# Patient Record
Sex: Female | Born: 1960 | Race: White | Hispanic: No | Marital: Married | Smoking: Current every day smoker
Health system: Southern US, Community
[De-identification: ages and names within clinical notes are randomized; demographics above are authoritative.]

---

## 2002-02-13 ENCOUNTER — Other Ambulatory Visit: Admission: RE | Admit: 2002-02-13 | Discharge: 2002-02-13 | Payer: Self-pay | Admitting: Obstetrics and Gynecology

## 2003-03-27 ENCOUNTER — Other Ambulatory Visit: Payer: Self-pay

## 2005-07-26 ENCOUNTER — Emergency Department: Payer: Self-pay | Admitting: Internal Medicine

## 2006-08-16 ENCOUNTER — Ambulatory Visit: Payer: Self-pay | Admitting: Obstetrics and Gynecology

## 2006-09-06 ENCOUNTER — Ambulatory Visit: Payer: Self-pay | Admitting: Obstetrics and Gynecology

## 2006-12-01 ENCOUNTER — Ambulatory Visit (HOSPITAL_COMMUNITY): Admission: RE | Admit: 2006-12-01 | Discharge: 2006-12-01 | Payer: Self-pay | Admitting: Obstetrics and Gynecology

## 2007-02-21 ENCOUNTER — Ambulatory Visit (HOSPITAL_COMMUNITY): Admission: RE | Admit: 2007-02-21 | Discharge: 2007-02-21 | Payer: Self-pay | Admitting: Obstetrics and Gynecology

## 2007-12-05 ENCOUNTER — Ambulatory Visit: Payer: Self-pay | Admitting: Obstetrics and Gynecology

## 2008-03-23 HISTORY — PX: TUBAL LIGATION: SHX77

## 2010-07-31 ENCOUNTER — Ambulatory Visit: Payer: Self-pay | Admitting: Obstetrics & Gynecology

## 2010-08-05 NOTE — Op Note (Signed)
Whitney Hunt, Whitney Hunt                   ACCOUNT NO.:  [id number]   MEDICAL RECORD NO.:  [id number]          PATIENT TYPE:  AMB   LOCATION:  SDC                           FACILITY:  [facility]   PHYSICIAN:  Sherry A. Dickstein, M.D.DATE OF BIRTH:  [birth year]   DATE OF PROCEDURE:  12/01/2006  DATE OF DISCHARGE:                               OPERATIVE REPORT   PREOPERATIVE DIAGNOSIS:  Desires sterilization.   POSTOPERATIVE DIAGNOSIS:  Desires sterilization.   PROCEDURE:  Essure tubal occlusion.   SURGEON:  Sherry A. Rosalio Macadamia, M.D.   ANESTHESIA:  MAC.   INDICATIONS FOR PROCEDURE:  The patient is a 50 year old woman who  requests permanent sterilization procedure.  She has used a Merino in  the past but she did not like this and had to have it removed for a LEEP  procedure.  The patient wants Essure tubal occlusion.  If this did not  work, the patient requested laparoscopic tubal cautery.  The patient  understands the risks involved as well as the failure rate and  alternatives to this procedure.   FINDINGS:  Normal size uterus with normal cornua present.   PROCEDURE:  The patient was brought into the operating room and given  adequate IV sedation.  She was placed in the dorsal lithotomy position.  Her perineum was washed with Betadine.  A pelvic examination was  performed.  The patient was then draped in a sterile fashion.  The  surgeons gown and gloves were changed.  A speculum was placed within the  vagina.  The vagina was washed with Betadine.  A paracervical block was  administered with 1% Xylocaine.  The cervix was grasped with a single  tooth tenaculum.  The cervix was dilated with Pratt dilators to a #19.  The hysteroscope was introduced in the endometrial cavity.  The cornua  were visualized.  Using the Essure rings, they were introduced into the  left cornua and placed in the usual fashion.  They were introduced and  removed with three coils present in each cornua, first the left  cornua  was placed then the right cornua was placed, with three coils present at  the end.  Pictures were obtained.  No abnormalities were seen in the  endometrial cavity.  All instruments were removed from the vagina.  The  patient was taken out of the dorsal lithotomy position.  She was  awakened. She was moved from the operating room to a stretcher in stable  condition.  Complications were none.  Estimated blood loss less than 5  mL.      Sherry A. Rosalio Macadamia, M.D.  Electronically Signed     SAD/MEDQ  D:  12/01/2006  T:  12/01/2006  Job:  [id number]

## 2011-01-02 LAB — CBC
MCHC: 34.3
MCV: 92
Platelets: 304
RBC: 4.86

## 2011-12-10 ENCOUNTER — Ambulatory Visit: Payer: Self-pay | Admitting: Obstetrics & Gynecology

## 2011-12-14 ENCOUNTER — Encounter: Payer: Self-pay | Admitting: Internal Medicine

## 2012-01-22 ENCOUNTER — Ambulatory Visit (AMBULATORY_SURGERY_CENTER): Payer: BC Managed Care – PPO | Admitting: *Deleted

## 2012-01-22 VITALS — Ht 63.0 in | Wt 127.0 lb

## 2012-01-22 DIAGNOSIS — Z1211 Encounter for screening for malignant neoplasm of colon: Secondary | ICD-10-CM

## 2012-01-22 MED ORDER — MOVIPREP 100 G PO SOLR: ORAL | Status: DC

## 2012-02-05 ENCOUNTER — Ambulatory Visit (AMBULATORY_SURGERY_CENTER): Payer: BC Managed Care – PPO | Admitting: Internal Medicine

## 2012-02-05 VITALS — BP 105/65 | HR 70 | Temp 97.4°F | Resp 18 | Ht 63.0 in | Wt 127.0 lb

## 2012-02-05 DIAGNOSIS — Z1211 Encounter for screening for malignant neoplasm of colon: Secondary | ICD-10-CM

## 2012-02-05 MED ORDER — SODIUM CHLORIDE 0.9 % IV SOLN: 500.0000 mL | INTRAVENOUS | Status: DC

## 2012-02-05 NOTE — Op Note (Signed)
[facility] [address] [place], [address]   COLONOSCOPY PROCEDURE REPORT  PATIENT: Whitney, Hunt  MR#: [id number] BIRTHDATE: 01-28-1961 , 51  yrs. old GENDER: Female ENDOSCOPIST: Hart Carwin, MD REFERRED BY:  Genia Del, M.D. PROCEDURE DATE:  02/05/2012 PROCEDURE:   Colonoscopy, screening ASA CLASS:   Class I INDICATIONS:average risk patient for colon cancer. MEDICATIONS: MAC sedation, administered by CRNA and propofol (Diprivan) 250mg  IV  DESCRIPTION OF PROCEDURE:   After the risks and benefits and of the procedure were explained, informed consent was obtained.  A digital rectal exam revealed no abnormalities of the rectum.    The LB [id number]-[id number]  endoscope was introduced through the anus and advanced to the cecum, which was identified by both the appendix and ileocecal valve .  The quality of the prep was good, using MoviPrep .  The instrument was then slowly withdrawn as the colon was fully examined.     COLON FINDINGS: Mild diverticulosis was noted in the descending colon.     Retroflexed views revealed no abnormalities.     The scope was then withdrawn from the patient and the procedure completed.  COMPLICATIONS: There were no complications. ENDOSCOPIC IMPRESSION: Mild diverticulosis was noted in the descending colon  RECOMMENDATIONS: High fiber diet   REPEAT EXAM: In 10 year(s)  for Colonoscopy.  cc:  _______________________________ eSignedHart Carwin, MD 02/05/2012 12:20 PM

## 2012-02-05 NOTE — Patient Instructions (Signed)
YOU HAD AN ENDOSCOPIC PROCEDURE TODAY AT THE [facility]: Refer to the procedure report that was given to you for any specific questions about what was found during the examination.  If the procedure report does not answer your questions, please call your gastroenterologist to clarify.  If you requested that your care partner not be given the details of your procedure findings, then the procedure report has been included in a sealed envelope for you to review at your convenience later.  YOU SHOULD EXPECT: Some feelings of bloating in the abdomen. Passage of more gas than usual.  Walking can help get rid of the air that was put into your GI tract during the procedure and reduce the bloating. If you had a lower endoscopy (such as a colonoscopy or flexible sigmoidoscopy) you may notice spotting of blood in your stool or on the toilet paper. If you underwent a bowel prep for your procedure, then you may not have a normal bowel movement for a few days.  DIET: Your first meal following the procedure should be a light meal and then it is ok to progress to your normal diet.  A half-sandwich or bowl of soup is an example of a good first meal.  Heavy or fried foods are harder to digest and may make you feel nauseous or bloated.  Likewise meals heavy in dairy and vegetables can cause extra gas to form and this can also increase the bloating.  Drink plenty of fluids but you should avoid alcoholic beverages for 24 hours.  ACTIVITY: Your care partner should take you home directly after the procedure.  You should plan to take it easy, moving slowly for the rest of the day.  You can resume normal activity the day after the procedure however you should NOT DRIVE or use heavy machinery for 24 hours (because of the sedation medicines used during the test).    SYMPTOMS TO REPORT IMMEDIATELY: A gastroenterologist can be reached at any hour.  During normal business hours, 8:30 AM to 5:00 PM Monday through Friday,  call [phone].  After hours and on weekends, please call the GI answering service at [phone] who will take a message and have the physician on call contact you.   Following lower endoscopy (colonoscopy or flexible sigmoidoscopy):  Excessive amounts of blood in the stool  Significant tenderness or worsening of abdominal pains  Swelling of the abdomen that is new, acute  Fever of 100F or higher    FOLLOW UP: If any biopsies were taken you will be contacted by phone or by letter within the next 1-3 weeks.  Call your gastroenterologist if you have not heard about the biopsies in 3 weeks.  Our staff will call the home number listed on your records the next business day following your procedure to check on you and address any questions or concerns that you may have at that time regarding the information given to you following your procedure. This is a courtesy call and so if there is no answer at the home number and we have not heard from you through the emergency physician on call, we will assume that you have returned to your regular daily activities without incident.  SIGNATURES/CONFIDENTIALITY: You and/or your care partner have signed paperwork which will be entered into your electronic medical record.  These signatures attest to the fact that that the information above on your After Visit Summary has been reviewed and is understood.  Full responsibility of the confidentiality   of this discharge information lies with you and/or your care-partner.     Information on diverticulosis & high fiber diet given to you today 

## 2012-02-05 NOTE — Progress Notes (Signed)
Patient did not have preoperative order for IV antibiotic SSI prophylaxis. (G8918)  Patient did not experience any of the following events: a burn prior to discharge; a fall within the facility; wrong site/side/patient/procedure/implant event; or a hospital transfer or hospital admission upon discharge from the facility. (G8907)  

## 2012-02-08 ENCOUNTER — Telehealth: Payer: Self-pay

## 2012-02-08 NOTE — Telephone Encounter (Signed)
  Follow up Call-  Call back number 02/05/2012  Post procedure Call Back phone  # [phone]     Patient questions:  Do you have a fever, pain , or abdominal swelling? no Pain Score  0 *  Have you tolerated food without any problems? yes  Have you been able to return to your normal activities? yes  Do you have any questions about your discharge instructions: Diet   no Medications  no Follow up visit  no  Do you have questions or concerns about your Care? no  Actions: * If pain score is 4 or above: No action needed, pain <4.

## 2014-01-02 ENCOUNTER — Emergency Department: Payer: Self-pay | Admitting: Emergency Medicine

## 2014-01-02 LAB — COMPREHENSIVE METABOLIC PANEL
ALT: 47 U/L
AST: 28 U/L (ref 15–37)
Albumin: 3 g/dL — ABNORMAL LOW (ref 3.4–5.0)
Alkaline Phosphatase: 95 U/L
Anion Gap: 8 (ref 7–16)
BILIRUBIN TOTAL: 0.6 mg/dL (ref 0.2–1.0)
BUN: 11 mg/dL (ref 7–18)
CHLORIDE: 100 mmol/L (ref 98–107)
CREATININE: 0.78 mg/dL (ref 0.60–1.30)
Calcium, Total: 8.5 mg/dL (ref 8.5–10.1)
Co2: 22 mmol/L (ref 21–32)
EGFR (Non-African Amer.): >60
GLUCOSE: 119 mg/dL — AB (ref 65–99)
Osmolality: 261 (ref 275–301)
POTASSIUM: 3.5 mmol/L (ref 3.5–5.1)
SODIUM: 130 mmol/L — AB (ref 136–145)
Total Protein: 7.1 g/dL (ref 6.4–8.2)

## 2014-01-02 LAB — URINALYSIS, COMPLETE
Bacteria: NONE SEEN
Bilirubin,UR: NEGATIVE
Glucose,UR: 50 mg/dL (ref 0–75)
LEUKOCYTE ESTERASE: NEGATIVE
Nitrite: NEGATIVE
Ph: 6 (ref 4.5–8.0)
Protein: NEGATIVE
RBC,UR: 1 /HPF (ref 0–5)
SPECIFIC GRAVITY: 1.004 (ref 1.003–1.030)
Squamous Epithelial: 11

## 2014-01-02 LAB — CBC WITH DIFFERENTIAL/PLATELET
BASOS PCT: 0.3 %
Basophil #: 0 10*3/uL (ref 0.0–0.1)
Eosinophil #: 0.7 10*3/uL (ref 0.0–0.7)
Eosinophil %: 5 %
HCT: 45.2 % (ref 35.0–47.0)
HGB: 14.5 g/dL (ref 12.0–16.0)
LYMPHS ABS: 0.8 10*3/uL — AB (ref 1.0–3.6)
LYMPHS PCT: 6 %
MCH: 30 pg (ref 26.0–34.0)
MCHC: 32 g/dL (ref 32.0–36.0)
MCV: 94 fL (ref 80–100)
Monocyte #: 1 x10 3/mm — ABNORMAL HIGH (ref 0.2–0.9)
Monocyte %: 7.6 %
NEUTROS ABS: 10.6 10*3/uL — AB (ref 1.4–6.5)
Neutrophil %: 81.1 %
Platelet: 242 10*3/uL (ref 150–440)
RBC: 4.82 10*6/uL (ref 3.80–5.20)
RDW: 13.4 % (ref 11.5–14.5)
WBC: 13.1 10*3/uL — ABNORMAL HIGH (ref 3.6–11.0)

## 2015-02-01 ENCOUNTER — Other Ambulatory Visit: Payer: Self-pay | Admitting: Obstetrics & Gynecology

## 2015-02-01 DIAGNOSIS — Z1231 Encounter for screening mammogram for malignant neoplasm of breast: Secondary | ICD-10-CM

## 2015-02-21 ENCOUNTER — Ambulatory Visit
Admission: RE | Admit: 2015-02-21 | Discharge: 2015-02-21 | Disposition: A | Discharge status: Home / Self Care | Payer: 59 | Source: Ambulatory Visit | Attending: Obstetrics & Gynecology | Admitting: Obstetrics & Gynecology

## 2015-02-21 DIAGNOSIS — Z1231 Encounter for screening mammogram for malignant neoplasm of breast: Secondary | ICD-10-CM | POA: Insufficient documentation

## 2016-06-11 DIAGNOSIS — Z0000 Encounter for general adult medical examination without abnormal findings: Secondary | ICD-10-CM | POA: Diagnosis not present

## 2016-06-18 DIAGNOSIS — Z0000 Encounter for general adult medical examination without abnormal findings: Secondary | ICD-10-CM | POA: Diagnosis not present

## 2016-06-18 DIAGNOSIS — E538 Deficiency of other specified B group vitamins: Secondary | ICD-10-CM | POA: Diagnosis not present

## 2016-09-17 DIAGNOSIS — E538 Deficiency of other specified B group vitamins: Secondary | ICD-10-CM | POA: Diagnosis not present

## 2016-09-24 DIAGNOSIS — D72829 Elevated white blood cell count, unspecified: Secondary | ICD-10-CM | POA: Diagnosis not present

## 2016-10-01 DIAGNOSIS — E538 Deficiency of other specified B group vitamins: Secondary | ICD-10-CM | POA: Diagnosis not present

## 2016-10-01 DIAGNOSIS — D72829 Elevated white blood cell count, unspecified: Secondary | ICD-10-CM | POA: Diagnosis not present

## 2017-03-30 DIAGNOSIS — Z01419 Encounter for gynecological examination (general) (routine) without abnormal findings: Secondary | ICD-10-CM | POA: Diagnosis not present

## 2017-03-30 DIAGNOSIS — Z6823 Body mass index (BMI) 23.0-23.9, adult: Secondary | ICD-10-CM | POA: Diagnosis not present

## 2017-03-30 DIAGNOSIS — Z1231 Encounter for screening mammogram for malignant neoplasm of breast: Secondary | ICD-10-CM | POA: Diagnosis not present

## 2017-06-15 DIAGNOSIS — Z0000 Encounter for general adult medical examination without abnormal findings: Secondary | ICD-10-CM | POA: Diagnosis not present

## 2017-06-15 DIAGNOSIS — E538 Deficiency of other specified B group vitamins: Secondary | ICD-10-CM | POA: Diagnosis not present

## 2017-06-22 DIAGNOSIS — E538 Deficiency of other specified B group vitamins: Secondary | ICD-10-CM | POA: Diagnosis not present

## 2017-06-22 DIAGNOSIS — Z0000 Encounter for general adult medical examination without abnormal findings: Secondary | ICD-10-CM | POA: Diagnosis not present

## 2017-07-01 DIAGNOSIS — N926 Irregular menstruation, unspecified: Secondary | ICD-10-CM | POA: Diagnosis not present

## 2017-12-28 DIAGNOSIS — Z23 Encounter for immunization: Secondary | ICD-10-CM | POA: Diagnosis not present

## 2018-04-06 DIAGNOSIS — Z124 Encounter for screening for malignant neoplasm of cervix: Secondary | ICD-10-CM | POA: Diagnosis not present

## 2018-04-06 DIAGNOSIS — Z1231 Encounter for screening mammogram for malignant neoplasm of breast: Secondary | ICD-10-CM | POA: Diagnosis not present

## 2018-04-06 DIAGNOSIS — Z6825 Body mass index (BMI) 25.0-25.9, adult: Secondary | ICD-10-CM | POA: Diagnosis not present

## 2018-04-06 DIAGNOSIS — Z118 Encounter for screening for other infectious and parasitic diseases: Secondary | ICD-10-CM | POA: Diagnosis not present

## 2018-04-06 DIAGNOSIS — N760 Acute vaginitis: Secondary | ICD-10-CM | POA: Diagnosis not present

## 2018-04-06 DIAGNOSIS — Z01419 Encounter for gynecological examination (general) (routine) without abnormal findings: Secondary | ICD-10-CM | POA: Diagnosis not present

## 2018-06-17 DIAGNOSIS — Z1389 Encounter for screening for other disorder: Secondary | ICD-10-CM | POA: Diagnosis not present

## 2018-06-17 DIAGNOSIS — Z0000 Encounter for general adult medical examination without abnormal findings: Secondary | ICD-10-CM | POA: Diagnosis not present

## 2018-06-17 DIAGNOSIS — E538 Deficiency of other specified B group vitamins: Secondary | ICD-10-CM | POA: Diagnosis not present

## 2018-06-27 DIAGNOSIS — L820 Inflamed seborrheic keratosis: Secondary | ICD-10-CM | POA: Diagnosis not present

## 2018-06-27 DIAGNOSIS — Z0000 Encounter for general adult medical examination without abnormal findings: Secondary | ICD-10-CM | POA: Diagnosis not present

## 2018-06-27 DIAGNOSIS — E538 Deficiency of other specified B group vitamins: Secondary | ICD-10-CM | POA: Diagnosis not present

## 2019-04-01 ENCOUNTER — Emergency Department (HOSPITAL_COMMUNITY)
Admission: EM | Admit: 2019-04-01 | Discharge: 2019-04-01 | Disposition: A | Discharge status: Home / Self Care | Payer: BC Managed Care – PPO | Attending: Emergency Medicine | Admitting: Emergency Medicine

## 2019-04-01 ENCOUNTER — Emergency Department (HOSPITAL_COMMUNITY): Payer: BC Managed Care – PPO

## 2019-04-01 ENCOUNTER — Encounter (HOSPITAL_COMMUNITY): Payer: Self-pay | Admitting: Emergency Medicine

## 2019-04-01 ENCOUNTER — Other Ambulatory Visit: Payer: Self-pay

## 2019-04-01 DIAGNOSIS — I471 Supraventricular tachycardia: Secondary | ICD-10-CM | POA: Diagnosis not present

## 2019-04-01 DIAGNOSIS — R42 Dizziness and giddiness: Secondary | ICD-10-CM | POA: Insufficient documentation

## 2019-04-01 DIAGNOSIS — Z79899 Other long term (current) drug therapy: Secondary | ICD-10-CM | POA: Diagnosis not present

## 2019-04-01 DIAGNOSIS — F17210 Nicotine dependence, cigarettes, uncomplicated: Secondary | ICD-10-CM | POA: Diagnosis not present

## 2019-04-01 DIAGNOSIS — R5383 Other fatigue: Secondary | ICD-10-CM | POA: Insufficient documentation

## 2019-04-01 DIAGNOSIS — F419 Anxiety disorder, unspecified: Secondary | ICD-10-CM | POA: Diagnosis not present

## 2019-04-01 DIAGNOSIS — R0789 Other chest pain: Secondary | ICD-10-CM | POA: Diagnosis not present

## 2019-04-01 DIAGNOSIS — R002 Palpitations: Secondary | ICD-10-CM | POA: Diagnosis present

## 2019-04-01 LAB — COMPREHENSIVE METABOLIC PANEL
ALT: 43 U/L (ref 0–44)
AST: 51 U/L — ABNORMAL HIGH (ref 15–41)
Albumin: 3.8 g/dL (ref 3.5–5.0)
Alkaline Phosphatase: 72 U/L (ref 38–126)
Anion gap: 10 (ref 5–15)
BUN: 11 mg/dL (ref 6–20)
CO2: 25 mmol/L (ref 22–32)
Calcium: 8.7 mg/dL — ABNORMAL LOW (ref 8.9–10.3)
Chloride: 106 mmol/L (ref 98–111)
Creatinine, Ser: 0.99 mg/dL (ref 0.44–1.00)
GFR calc Af Amer: >60 mL/min (ref >60)
GFR calc non Af Amer: >60 mL/min (ref >60)
Glucose, Bld: 86 mg/dL (ref 70–99)
Potassium: 3.5 mmol/L (ref 3.5–5.1)
Sodium: 141 mmol/L (ref 135–145)
Total Bilirubin: 0.8 mg/dL (ref 0.3–1.2)
Total Protein: 5.9 g/dL — ABNORMAL LOW (ref 6.5–8.1)

## 2019-04-01 LAB — CBC WITH DIFFERENTIAL/PLATELET
Abs Immature Granulocytes: 0.07 10*3/uL (ref 0.00–0.07)
Basophils Absolute: 0.1 10*3/uL (ref 0.0–0.1)
Basophils Relative: 0 %
Eosinophils Absolute: 0 10*3/uL (ref 0.0–0.5)
Eosinophils Relative: 0 %
HCT: 43.9 % (ref 36.0–46.0)
Hemoglobin: 14.1 g/dL (ref 12.0–15.0)
Immature Granulocytes: 1 %
Lymphocytes Relative: 15 %
Lymphs Abs: 2.1 10*3/uL (ref 0.7–4.0)
MCH: 30.9 pg (ref 26.0–34.0)
MCHC: 32.1 g/dL (ref 30.0–36.0)
MCV: 96.3 fL (ref 80.0–100.0)
Monocytes Absolute: 1 10*3/uL (ref 0.1–1.0)
Monocytes Relative: 8 %
Neutro Abs: 10.6 10*3/uL — ABNORMAL HIGH (ref 1.7–7.7)
Neutrophils Relative %: 76 %
Platelets: 390 10*3/uL (ref 150–400)
RBC: 4.56 MIL/uL (ref 3.87–5.11)
RDW: 13.3 % (ref 11.5–15.5)
WBC: 13.8 10*3/uL — ABNORMAL HIGH (ref 4.0–10.5)
nRBC: 0 % (ref 0.0–0.2)

## 2019-04-01 LAB — TSH: TSH: 4.49 u[IU]/mL (ref 0.350–4.500)

## 2019-04-01 LAB — T4, FREE: Free T4: 1.06 ng/dL (ref 0.61–1.12)

## 2019-04-01 MED ORDER — METOPROLOL SUCCINATE ER 25 MG PO TB24: 25.0000 mg | ORAL_TABLET | Freq: Every day | ORAL | 0 refills | Status: AC

## 2019-04-01 NOTE — ED Triage Notes (Signed)
Pt to ED via [facility] with reported SVT.  Pt st's she felt her heart racing and had her husband drive her to the fire department where she was found to be in SVT  EMS gave pt Adenosine 6mg  without response then gave pt 12mg  and pt converted.  Pt also received ASA 324mg     Pt has no complaints at this time and say she feels great

## 2019-04-01 NOTE — ED Provider Notes (Signed)
[facility] EMERGENCY DEPARTMENT Provider Note   CSN: [id number] Arrival date & time: 04/01/19  1459     History Chief Complaint  Patient presents with  . SVT Converted    Whitney Hunt is a 59 y.o. female.  HPI     Has has episodes since she was a child "skip to my lou as I call them", Episodes of heart skipping then racing-- had been able to take a deep breath and get it to stop--has not yet sought care  Has had episodes like this her whole life, today did not try deep breath, was at work at the time, then felt too anxious and wanted to find out what was going on Today started while at work, felt palpitations and heart racing, lasted about 1-1.5 hr Feels fast hard heart rate, a little bit of pain in chest, very mild, more feeling of fatigue, being drained, lightheadedness  Comes and goes, has not found trigger for it   4 episodes over the last few weeks, prior to that was having them once every few months. More stress over the last few weeks.  Now working as Nature conservation officer at [employer], fast paced, hot, been there since August  Takes a muscle relaxer, vitamin D, flonase  Went to [employer], EMS arrived   No smoking (quit 2015), occ etoh, mar  Father had quadruple bypass, in 27s, not sure how old he was with first CAD diagnosis  History reviewed. No pertinent past medical history.  There are no problems to display for this patient.   Past Surgical History:  Procedure Laterality Date  . TUBAL LIGATION  2010     OB History   No obstetric history on file.     Family History  Problem Relation Age of Onset  . Cervical cancer Sister 58  . Colon cancer Neg Hx   . Stomach cancer Neg Hx   . Rectal cancer Neg Hx     Social History   Tobacco Use  . Smoking status: Current Every Day Smoker    Packs/day: 0.50  . Smokeless tobacco: Never Used  Substance Use Topics  . Alcohol use: No  . Drug use: No    Home Medications Prior to Admission  medications   Medication Sig Start Date End Date Taking? Authorizing Provider  Ibuprofen (ADVIL PO) Take by mouth as needed.    [provider]  metoprolol succinate (TOPROL-XL) 25 MG 24 hr tablet Take 1 tablet (25 mg total) by mouth daily. 04/01/19 05/01/19  Alvira Monday, MD  Multiple Vitamins-Minerals (MULTIVITAMIN GUMMIES ADULT) CHEW Chew by mouth daily.    [provider]    Allergies    Patient has no known allergies.  Review of Systems   Review of Systems  Constitutional: Positive for fatigue and unexpected weight change (from strenuous job). Negative for appetite change and fever.  HENT: Negative for sore throat.   Eyes: Negative for visual disturbance.  Respiratory: Negative for cough and shortness of breath.   Cardiovascular: Positive for chest pain (only while having palpitations) and palpitations. Negative for leg swelling.  Gastrointestinal: Negative for abdominal pain, diarrhea, nausea (brief episode) and vomiting.  Genitourinary: Negative for difficulty urinating.  Musculoskeletal: Negative for back pain and neck pain.  Skin: Negative for rash.  Neurological: Positive for light-headedness. Negative for syncope and headaches.    Physical Exam Updated Vital Signs BP 126/83 (BP Location: Right Arm)   Pulse 98   Temp 98.2 F (36.8 C) (Oral)  Resp 18   Ht 5\' 3"  (1.6 m)   Wt 56.7 kg   LMP 01/30/2015   SpO2 98%   BMI 22.14 kg/m   Physical Exam Vitals and nursing note reviewed.  Constitutional:      General: She is not in acute distress.    Appearance: She is well-developed. She is not diaphoretic.  HENT:     Head: Normocephalic and atraumatic.  Eyes:     Conjunctiva/sclera: Conjunctivae normal.  Cardiovascular:     Rate and Rhythm: Normal rate and regular rhythm.     Heart sounds: Normal heart sounds. No murmur. No friction rub. No gallop.   Pulmonary:     Effort: Pulmonary effort is normal. No respiratory distress.     Breath sounds:  Normal breath sounds. No wheezing or rales.  Abdominal:     General: There is no distension.     Palpations: Abdomen is soft.     Tenderness: There is no abdominal tenderness. There is no guarding.  Musculoskeletal:        General: No tenderness.     Cervical back: Normal range of motion.  Skin:    General: Skin is warm and dry.     Findings: No erythema or rash.  Neurological:     Mental Status: She is alert and oriented to person, place, and time.       ED Results / Procedures / Treatments   Labs (all labs ordered are listed, but only abnormal results are displayed) Labs Reviewed  CBC WITH DIFFERENTIAL/PLATELET - Abnormal; Notable for the following components:      Result Value   WBC 13.8 (*)    Neutro Abs 10.6 (*)    All other components within normal limits  COMPREHENSIVE METABOLIC PANEL - Abnormal; Notable for the following components:   Calcium 8.7 (*)    Total Protein 5.9 (*)    AST 51 (*)    All other components within normal limits  TSH  T4, FREE    EKG EKG Interpretation  Date/Time:  Saturday April 01 2019 15:09:44 EST Ventricular Rate:  104 PR Interval:    QRS Duration: 85 QT Interval:  350 QTC Calculation: 461 R Axis:   88 Text Interpretation: Sinus tachycardia Since prior ECG, rate has increased Confirmed by Gareth Morgan [phone]) on 04/01/2019 3:33:18 PM   Radiology DG Chest Portable 1 View  Result Date: 04/01/2019 CLINICAL DATA:  Supraventricular tachycardia EXAM: PORTABLE CHEST 1 VIEW COMPARISON:  None. FINDINGS: Lungs are clear. Heart size and pulmonary vascularity are normal. No adenopathy. There is mild upper thoracic levoscoliosis. No pneumothorax. IMPRESSION: Lungs clear.  Cardiac silhouette normal.  No adenopathy. Electronically Signed   By: Lowella Grip III M.D.   On: 04/01/2019 15:30    Procedures Procedures (including critical care time)  Medications Ordered in ED Medications - No data to display  ED Course  I have reviewed  the triage vital signs and the nursing notes.  Pertinent labs & imaging results that were available during my care of the patient were reviewed by me and considered in my medical decision making (see chart for details).       MDM Rules/Calculators/A&P                       59 year old female with no known medical history, however history of palpitations since she was young that resolved with Valsalva maneuvers, who presents with concern for palpitations and was found to be in SVT  by EMS.  EMS noted patient to be in SVT, gave 6 mg of adenosine initially without any response, gave 12 mg with return to sinus rhythm.  She is asymptomatic following treatment with adenosine and conversion to sinus rhythm.  Reported some chest discomfort while she was having heart racing, but does not have any continuing chest discomfort.  Describes episodes that have occurred throughout her life that have resolved with breathing exercises which sound consistent with likely longstanding episodes of SVT, however has been having them more frequently over the last several weeks.  She does not have any other symptoms of systemic illness, denies other specific triggers with the exception of stress.  Low suspicion for pulmonary embolus, or ACS given history.  Labs without significant abnormalities. CXR WNL. TSH WNL.   Dr. Antoine Poche of Cardiology will send message to help organize EP follow up for patient. Will initiate metoprolol XL 25mg  daily. Discussed reasons to return to the ED. Patient discharged in stable condition with understanding of reasons to return.    Final Clinical Impression(s) / ED Diagnoses Final diagnoses:  SVT (supraventricular tachycardia) (HCC)    Rx / DC Orders ED Discharge Orders         Ordered    metoprolol succinate (TOPROL-XL) 25 MG 24 hr tablet  Daily     04/01/19 1636           05/30/19, MD 04/01/19 1738

## 2019-06-23 ENCOUNTER — Ambulatory Visit: Payer: BC Managed Care – PPO | Attending: Internal Medicine

## 2019-06-23 DIAGNOSIS — Z23 Encounter for immunization: Secondary | ICD-10-CM

## 2019-06-23 NOTE — Progress Notes (Signed)
   Covid-19 Vaccination Clinic  Name:  Kathryn Cosby    MRN: [id number] DOB: 1960/04/13  06/23/2019  Ms. Mclaurin was observed post Covid-19 immunization for 15 minutes without incident. She was provided with Vaccine Information Sheet and instruction to access the V-Safe system.   Ms. Reino was instructed to call 911 with any severe reactions post vaccine: Marland Kitchen Difficulty breathing  . Swelling of face and throat  . A fast heartbeat  . A bad rash all over body  . Dizziness and weakness   Immunizations Administered    Name Date Dose VIS Date Route   Pfizer COVID-19 Vaccine 06/23/2019  4:16 PM 0.3 mL 03/03/2019 Intramuscular   Manufacturer: [employer], [employer]   Lot: [id number]   NDC: [address]-8

## 2019-07-18 ENCOUNTER — Ambulatory Visit: Payer: BC Managed Care – PPO | Attending: Internal Medicine

## 2019-07-18 DIAGNOSIS — Z23 Encounter for immunization: Secondary | ICD-10-CM

## 2019-07-18 NOTE — Progress Notes (Signed)
   Covid-19 Vaccination Clinic  Name:  Dagmar Adcox    MRN: [id number] DOB: 07-21-1960  07/18/2019  Ms. Oguin was observed post Covid-19 immunization for 15 minutes without incident. She was provided with Vaccine Information Sheet and instruction to access the V-Safe system.   Ms. Stankovic was instructed to call 911 with any severe reactions post vaccine: Marland Kitchen Difficulty breathing  . Swelling of face and throat  . A fast heartbeat  . A bad rash all over body  . Dizziness and weakness   Immunizations Administered    Name Date Dose VIS Date Route   Pfizer COVID-19 Vaccine 07/18/2019  9:30 AM 0.3 mL 05/17/2018 Intramuscular   Manufacturer: [employer], [employer]   Lot: [id number]   NDC: [address]-6

## 2020-05-26 IMAGING — DX DG CHEST 1V PORT
1 series · 1 of 1 positions shown · non-contrast
Comparison: None.

CLINICAL DATA: Supraventricular tachycardia

EXAM:
PORTABLE CHEST 1 VIEW

[chest]
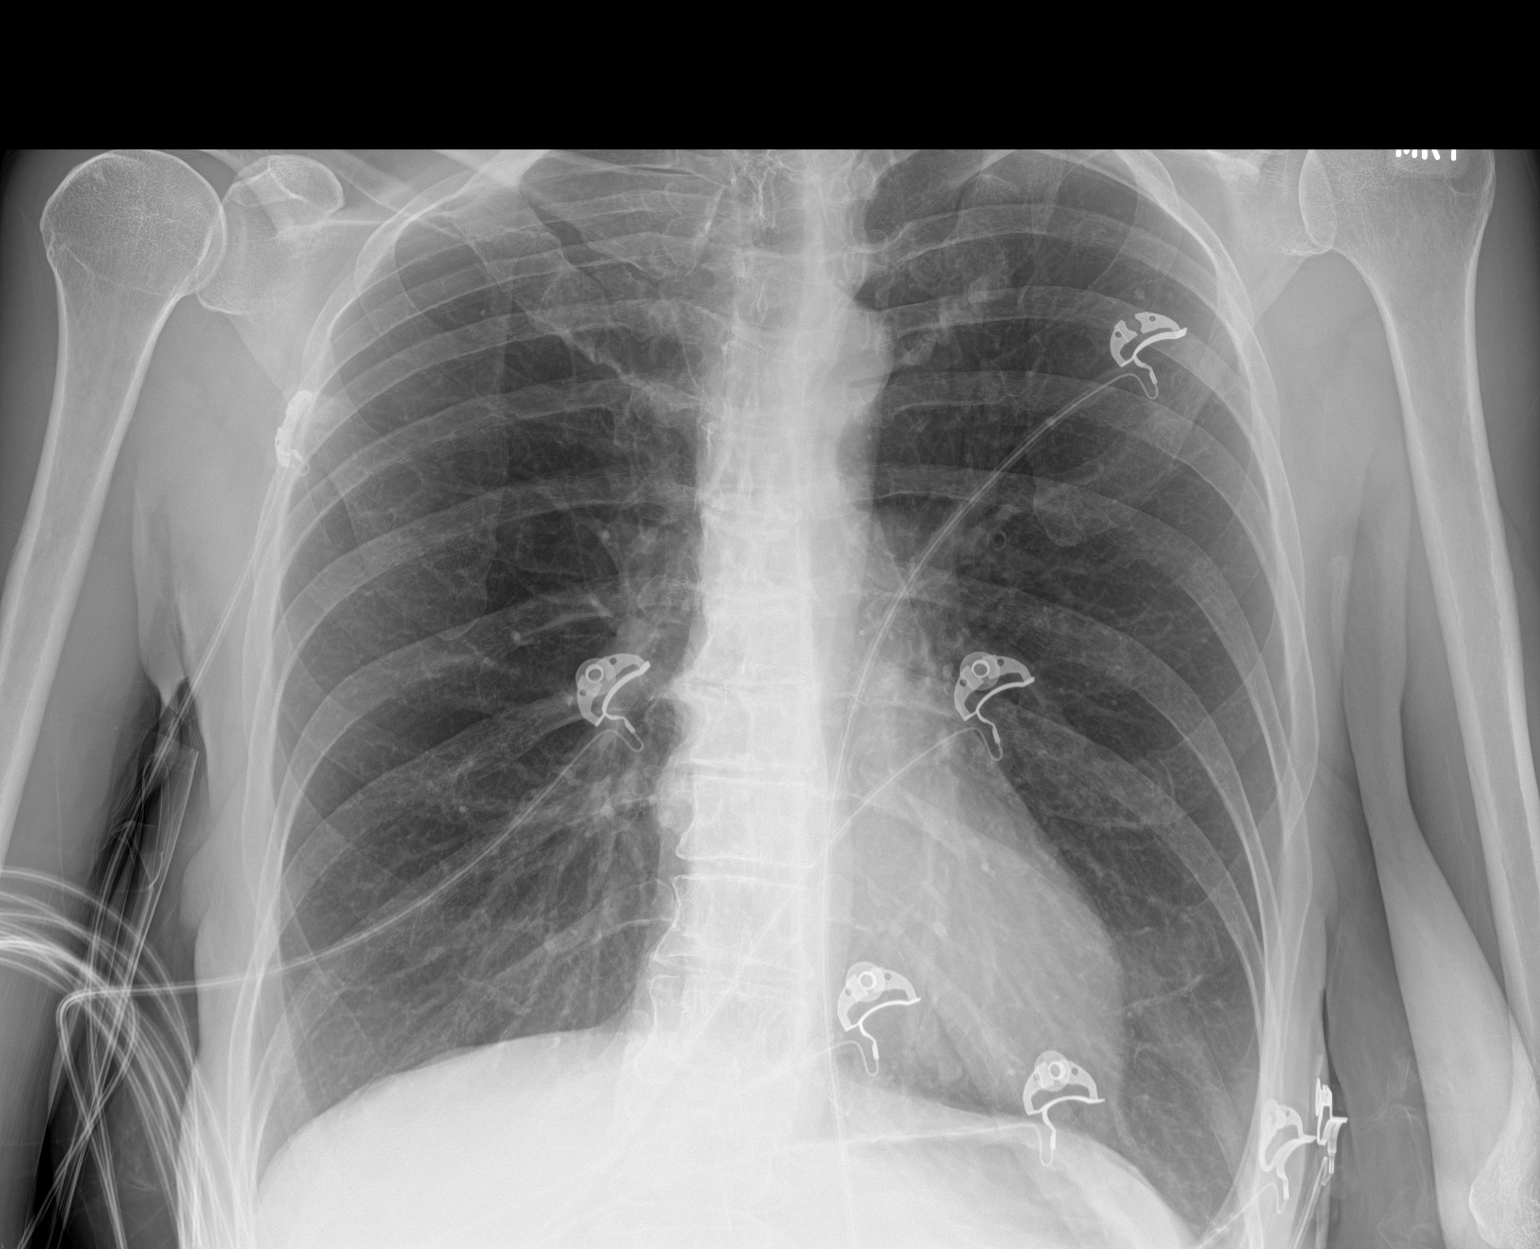

[1 of 1 positions shown; findings below may reference images not displayed]

FINDINGS: Lungs are clear. Heart size and pulmonary vascularity are normal. No
adenopathy. There is mild upper thoracic levoscoliosis. No
pneumothorax.
IMPRESSION: Lungs clear.  Cardiac silhouette normal.  No adenopathy.

## 2021-04-13 ENCOUNTER — Encounter (HOSPITAL_COMMUNITY): Payer: Self-pay | Admitting: Nurse Practitioner

## 2021-04-13 ENCOUNTER — Other Ambulatory Visit (HOSPITAL_COMMUNITY)
Admission: EM | Admit: 2021-04-13 | Discharge: 2021-04-14 | Disposition: A | Discharge status: Home / Self Care | Payer: BC Managed Care – PPO | Attending: Nurse Practitioner | Admitting: Nurse Practitioner

## 2021-04-13 DIAGNOSIS — F332 Major depressive disorder, recurrent severe without psychotic features: Secondary | ICD-10-CM

## 2021-04-13 DIAGNOSIS — Z20822 Contact with and (suspected) exposure to covid-19: Secondary | ICD-10-CM | POA: Diagnosis not present

## 2021-04-13 DIAGNOSIS — F1414 Cocaine abuse with cocaine-induced mood disorder: Secondary | ICD-10-CM | POA: Diagnosis present

## 2021-04-13 DIAGNOSIS — F1494 Cocaine use, unspecified with cocaine-induced mood disorder: Secondary | ICD-10-CM

## 2021-04-13 DIAGNOSIS — F1290 Cannabis use, unspecified, uncomplicated: Secondary | ICD-10-CM | POA: Insufficient documentation

## 2021-04-13 DIAGNOSIS — F10939 Alcohol use, unspecified with withdrawal, unspecified: Secondary | ICD-10-CM | POA: Insufficient documentation

## 2021-04-13 DIAGNOSIS — F17210 Nicotine dependence, cigarettes, uncomplicated: Secondary | ICD-10-CM | POA: Diagnosis not present

## 2021-04-13 LAB — POCT URINE DRUG SCREEN - MANUAL ENTRY (I-SCREEN)
POC Amphetamine UR: NOT DETECTED
POC Buprenorphine (BUP): NOT DETECTED
POC Cocaine UR: POSITIVE — AB
POC Marijuana UR: POSITIVE — AB
POC Methadone UR: NOT DETECTED
POC Methamphetamine UR: NOT DETECTED
POC Morphine: NOT DETECTED
POC Oxazepam (BZO): NOT DETECTED
POC Oxycodone UR: NOT DETECTED
POC Secobarbital (BAR): NOT DETECTED

## 2021-04-13 LAB — CBC WITH DIFFERENTIAL/PLATELET
Abs Immature Granulocytes: 0.03 10*3/uL (ref 0.00–0.07)
Basophils Absolute: 0.1 10*3/uL (ref 0.0–0.1)
Basophils Relative: 1 %
Eosinophils Absolute: 0.3 10*3/uL (ref 0.0–0.5)
Eosinophils Relative: 3 %
HCT: 45.9 % (ref 36.0–46.0)
Hemoglobin: 15.5 g/dL — ABNORMAL HIGH (ref 12.0–15.0)
Immature Granulocytes: 0 %
Lymphocytes Relative: 40 %
Lymphs Abs: 3.9 10*3/uL (ref 0.7–4.0)
MCH: 30.9 pg (ref 26.0–34.0)
MCHC: 33.8 g/dL (ref 30.0–36.0)
MCV: 91.4 fL (ref 80.0–100.0)
Monocytes Absolute: 0.8 10*3/uL (ref 0.1–1.0)
Monocytes Relative: 8 %
Neutro Abs: 4.6 10*3/uL (ref 1.7–7.7)
Neutrophils Relative %: 48 %
Platelets: 390 10*3/uL (ref 150–400)
RBC: 5.02 MIL/uL (ref 3.87–5.11)
RDW: 12.6 % (ref 11.5–15.5)
WBC: 9.6 10*3/uL (ref 4.0–10.5)
nRBC: 0 % (ref 0.0–0.2)

## 2021-04-13 LAB — COMPREHENSIVE METABOLIC PANEL
ALT: 11 U/L (ref 0–44)
AST: 15 U/L (ref 15–41)
Albumin: 4.3 g/dL (ref 3.5–5.0)
Alkaline Phosphatase: 93 U/L (ref 38–126)
Anion gap: 11 (ref 5–15)
BUN: 9 mg/dL (ref 6–20)
CO2: 24 mmol/L (ref 22–32)
Calcium: 9.4 mg/dL (ref 8.9–10.3)
Chloride: 105 mmol/L (ref 98–111)
Creatinine, Ser: 0.64 mg/dL (ref 0.44–1.00)
GFR, Estimated: >60 mL/min (ref >60)
Glucose, Bld: 91 mg/dL (ref 70–99)
Potassium: 3.8 mmol/L (ref 3.5–5.1)
Sodium: 140 mmol/L (ref 135–145)
Total Bilirubin: 0.2 mg/dL — ABNORMAL LOW (ref 0.3–1.2)
Total Protein: 6.8 g/dL (ref 6.5–8.1)

## 2021-04-13 LAB — LIPID PANEL
Cholesterol: 211 mg/dL — ABNORMAL HIGH (ref 0–200)
HDL: 56 mg/dL (ref >40)
LDL Cholesterol: 109 mg/dL — ABNORMAL HIGH (ref 0–99)
Total CHOL/HDL Ratio: 3.8 RATIO
Triglycerides: 229 mg/dL — ABNORMAL HIGH (ref <150)
VLDL: 46 mg/dL — ABNORMAL HIGH (ref 0–40)

## 2021-04-13 LAB — ETHANOL: Alcohol, Ethyl (B): 136 mg/dL — ABNORMAL HIGH (ref <10)

## 2021-04-13 LAB — POC SARS CORONAVIRUS 2 AG -  ED: SARS Coronavirus 2 Ag: NEGATIVE

## 2021-04-13 LAB — MAGNESIUM: Magnesium: 2.3 mg/dL (ref 1.7–2.4)

## 2021-04-13 LAB — TSH: TSH: 1.581 u[IU]/mL (ref 0.350–4.500)

## 2021-04-13 LAB — POCT PREGNANCY, URINE: Preg Test, Ur: NEGATIVE

## 2021-04-13 LAB — POC SARS CORONAVIRUS 2 AG: SARSCOV2ONAVIRUS 2 AG: NEGATIVE

## 2021-04-13 LAB — RESP PANEL BY RT-PCR (FLU A&B, COVID) ARPGX2
Influenza A by PCR: NEGATIVE
Influenza B by PCR: NEGATIVE
SARS Coronavirus 2 by RT PCR: NEGATIVE

## 2021-04-13 MED ORDER — ADULT MULTIVITAMIN W/MINERALS CH
1.0000 | ORAL_TABLET | Freq: Every day | ORAL | Status: DC
  Administered 2021-04-13 – 2021-04-14 (×2): 1 via ORAL
  Filled 2021-04-13 (×2): qty 1

## 2021-04-13 MED ORDER — NAPROXEN 500 MG PO TABS: 500.0000 mg | ORAL_TABLET | Freq: Two times a day (BID) | ORAL | Status: DC | PRN

## 2021-04-13 MED ORDER — CLONIDINE HCL 0.1 MG PO TABS
0.1000 mg | ORAL_TABLET | Freq: Four times a day (QID) | ORAL | Status: DC
  Administered 2021-04-13: 0.1 mg via ORAL
  Filled 2021-04-13: qty 1

## 2021-04-13 MED ORDER — TRAZODONE HCL 50 MG PO TABS: 50.0000 mg | ORAL_TABLET | Freq: Every evening | ORAL | Status: DC | PRN

## 2021-04-13 MED ORDER — CLONIDINE HCL 0.1 MG PO TABS: 0.1000 mg | ORAL_TABLET | Freq: Every day | ORAL | Status: DC

## 2021-04-13 MED ORDER — THIAMINE HCL 100 MG PO TABS
100.0000 mg | ORAL_TABLET | Freq: Every day | ORAL | Status: DC
  Administered 2021-04-14: 100 mg via ORAL
  Filled 2021-04-13: qty 1

## 2021-04-13 MED ORDER — ALUM & MAG HYDROXIDE-SIMETH 200-200-20 MG/5ML PO SUSP: 30.0000 mL | ORAL | Status: DC | PRN

## 2021-04-13 MED ORDER — THIAMINE HCL 100 MG/ML IJ SOLN
100.0000 mg | Freq: Once | INTRAMUSCULAR | Status: AC
  Administered 2021-04-13: 100 mg via INTRAMUSCULAR
  Filled 2021-04-13: qty 2

## 2021-04-13 MED ORDER — DICYCLOMINE HCL 20 MG PO TABS
20.0000 mg | ORAL_TABLET | Freq: Four times a day (QID) | ORAL | Status: DC | PRN
  Administered 2021-04-13: 20 mg via ORAL
  Filled 2021-04-13: qty 1

## 2021-04-13 MED ORDER — ACETAMINOPHEN 325 MG PO TABS
650.0000 mg | ORAL_TABLET | Freq: Four times a day (QID) | ORAL | Status: DC | PRN
  Administered 2021-04-13 – 2021-04-14 (×2): 650 mg via ORAL
  Filled 2021-04-13 (×2): qty 2

## 2021-04-13 MED ORDER — METHOCARBAMOL 500 MG PO TABS: 500.0000 mg | ORAL_TABLET | Freq: Three times a day (TID) | ORAL | Status: DC | PRN

## 2021-04-13 MED ORDER — MAGNESIUM HYDROXIDE 400 MG/5ML PO SUSP: 30.0000 mL | Freq: Every day | ORAL | Status: DC | PRN

## 2021-04-13 MED ORDER — LOPERAMIDE HCL 2 MG PO CAPS: 2.0000 mg | ORAL_CAPSULE | ORAL | Status: DC | PRN

## 2021-04-13 MED ORDER — LORAZEPAM 1 MG PO TABS
1.0000 mg | ORAL_TABLET | Freq: Once | ORAL | Status: AC
  Administered 2021-04-13: 1 mg via ORAL
  Filled 2021-04-13: qty 1

## 2021-04-13 MED ORDER — CLONIDINE HCL 0.1 MG PO TABS: 0.1000 mg | ORAL_TABLET | ORAL | Status: DC

## 2021-04-13 MED ORDER — ONDANSETRON 4 MG PO TBDP: 4.0000 mg | ORAL_TABLET | Freq: Four times a day (QID) | ORAL | Status: DC | PRN

## 2021-04-13 MED ORDER — HYDROXYZINE HCL 25 MG PO TABS
25.0000 mg | ORAL_TABLET | Freq: Four times a day (QID) | ORAL | Status: DC | PRN
  Administered 2021-04-13: 25 mg via ORAL
  Filled 2021-04-13: qty 1

## 2021-04-13 MED ORDER — CHLORDIAZEPOXIDE HCL 25 MG PO CAPS: 25.0000 mg | ORAL_CAPSULE | Freq: Four times a day (QID) | ORAL | Status: DC | PRN

## 2021-04-13 MED ORDER — HYDROXYZINE HCL 25 MG PO TABS: 25.0000 mg | ORAL_TABLET | Freq: Three times a day (TID) | ORAL | Status: DC | PRN

## 2021-04-13 NOTE — ED Notes (Signed)
Pt offered breakfast; declined. Will continue to monitor for safety.
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
## 2021-04-13 NOTE — ED Notes (Signed)
Pt oriented to unit; pt offered food and fluids. Pt interacting appropriately with RN.

## 2021-04-13 NOTE — ED Notes (Signed)
Pt eating dinner
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
## 2021-04-13 NOTE — ED Notes (Signed)
Patient admitted to [facility] from [facility].  Upon arrival to unit patient was tearful, mildly irritable and guarded.  She was negative in thought and initially rejected verbal support from Clinical research associate.  After several attempts to engage patient writer was able to make connection in first steps in establishing rapport.  Patient was given some ice water and was able to participate in admission process.  Patient oriented to unit and shown to her room.  She continued to be tearful but was more capable of engaging in conversation and to listening to support measures.  Patient complained of muscle aches, head ache and anxiety.  She was given standing clonidine 0.1mg , vistoril, 650mg  tylenol, and bentyl which was ordered PO PRN by provider.  Patient was assisted in making her bed and is now resting comfortably.  Will continue to monitor and provide verbal emotional support to her as needed.  Patient did express passive suicidal thoughts with no plan.  She asked, "What is it all for." Encouraged her to write a gratitude list of 10 things she is grateful for in her life.  Also, to write 5 positive attributes she possesses.  She did deny having plans to harm self and agreed to seek out staff if overwhelmed by thoughts or feelings.

## 2021-04-13 NOTE — Progress Notes (Signed)
Patient is now awake and reportedly feeling much better than earlier.  She was given dinner and is eating in day room with peers.  Patient less tearful and better related with fuller affect.  Patient denies avh shi or plan.  No evidence of withdrawal symptoms at this time. Will monitor and provide ongoing support.

## 2021-04-13 NOTE — ED Notes (Signed)
Whitney Hunt is showing up on my Ipad on [facility] but she's still in the assessment room on the other side of the building waiting to have labs and Covid test done. The flow chart is saying she many has overdue rounds that I can't do.

## 2021-04-13 NOTE — ED Notes (Signed)
Pt resting in bed. A&O x4, calm and cooperative. Denies current SI/HI/AVH. Denies any current needs. No signs of acute distress noted. Will continue to monitor for safety.  

## 2021-04-13 NOTE — ED Notes (Addendum)
Pt admitted to [facility] endorsing SI thoughts with no plan and substance abuse. Pt denies SI,HI,AVH at present. Patient was cooperative during the admission assessment. Skin assessment complete. Belongings inventoried. Patient oriented to unit and unit rules. Meal and drinks offered to patient.  Patient verbalized agreement to treatment plans. Patient verbally contracts for safety while hospitalized. Pending  2 hour COVID result and transfer to [facility]. Will monitor for safety.
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
## 2021-04-13 NOTE — ED Notes (Signed)
Regarding 15 mins check - patient is in [facility] for labs and for wait for 2 hr COVID test

## 2021-04-13 NOTE — Progress Notes (Signed)
Patient remains asleep in bed without distress or complaint.  Will continue to monitor and provide safe environment.

## 2021-04-13 NOTE — ED Notes (Signed)
Pt currently resting on pull out bed. No s&s of distress observed. Safety maintained and will continue to monitor.  °

## 2021-04-13 NOTE — ED Notes (Signed)
Pt given worksheet to complete for group. The activity consisted of creating a goal of the day associated with self-care.

## 2021-04-14 LAB — HEMOGLOBIN A1C
Hgb A1c MFr Bld: 5.7 % — ABNORMAL HIGH (ref 4.8–5.6)
Mean Plasma Glucose: 117 mg/dL

## 2021-04-14 MED ORDER — METOPROLOL SUCCINATE ER 25 MG PO TB24
25.0000 mg | ORAL_TABLET | Freq: Every day | ORAL | Status: DC
  Administered 2021-04-14: 25 mg via ORAL
  Filled 2021-04-14: qty 1

## 2021-04-14 NOTE — ED Notes (Signed)
Cecilio Asper, NP made aware of pt's manual BP reading: 168/110, pulse 100.

## 2021-04-14 NOTE — ED Notes (Signed)
Pt asleep in bed. Respirations even and unlabored. Will continue to monitor for safety. ?
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
## 2021-04-14 NOTE — ED Notes (Signed)
Pt currently resting in bed in assigned room. No distressed observed. Safety maintained and will continue to monitor.
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
## 2021-04-14 NOTE — Group Note (Signed)
Group Topic: Wellness  Group Date: 04/14/2021 Start Time: 1000 End Time: 1041 Facilitators: Levander Campion  Department: [facility]  Number of Participants: 2  Group Focus: activities of daily living skills and coping skills Treatment Modality:  Patient-Centered Therapy Interventions utilized were patient education Purpose: enhance coping skills  Name: Whitney Hunt Date of Birth: 1960-06-10  MR: [id number]    Level of Participation: minimal Quality of Participation: disruptive and distracting to others Interactions with others: sarcastic Mood/Affect: tearful Triggers (if applicable): n/a Cognition: coherent/clear Progress: Minimal Response: n/a Plan: follow-up needed  Patients Problems:  Patient Active Problem List   Diagnosis Date Noted   Cocaine-induced mood disorder (HCC) 04/13/2021

## 2021-04-14 NOTE — Discharge Instructions (Addendum)

## 2021-04-14 NOTE — Progress Notes (Signed)
Whitney Hunt woke up and stated she had a headache and a stiff neck. She asked for tylenol. She wanted to know when she was going home and when the providers will come and talk with her. She was informed of an estimated tine and was given the current time of 0745 hrs.

## 2021-04-14 NOTE — Progress Notes (Signed)
Whitney Hunt received her discharge order, she reviewed the AVS, questions answered  and her personal belongings were returned. B/P was rechecked and remains elevated. She stated her B/P is going to remain high here related to being stressed and wanting to leave. She was escorted to the lobby to call her friend.

## 2021-04-29 ENCOUNTER — Telehealth (HOSPITAL_COMMUNITY): Payer: Self-pay | Admitting: Internal Medicine

## 2021-04-29 NOTE — BH Assessment (Signed)
Care Management - [facility]UC Follow Up Discharges   Writer attempted to make contact with patient today and was unsuccessful.  Writer left a HIPPA compliant voice message.   Per chart review, patient was received her medication from her PCP Bethann Punches.

## 2021-05-01 ENCOUNTER — Encounter (HOSPITAL_COMMUNITY): Payer: Self-pay

## 2021-05-01 ENCOUNTER — Emergency Department (HOSPITAL_COMMUNITY)
Admission: EM | Admit: 2021-05-01 | Discharge: 2021-05-02 | Disposition: A | Discharge status: Other Acute Inpatient Hospital | Payer: BC Managed Care – PPO | Attending: Emergency Medicine | Admitting: Emergency Medicine

## 2021-05-01 DIAGNOSIS — F332 Major depressive disorder, recurrent severe without psychotic features: Secondary | ICD-10-CM | POA: Insufficient documentation

## 2021-05-01 DIAGNOSIS — Z046 Encounter for general psychiatric examination, requested by authority: Secondary | ICD-10-CM | POA: Diagnosis present

## 2021-05-01 DIAGNOSIS — Z79899 Other long term (current) drug therapy: Secondary | ICD-10-CM | POA: Insufficient documentation

## 2021-05-01 DIAGNOSIS — Z20822 Contact with and (suspected) exposure to covid-19: Secondary | ICD-10-CM | POA: Insufficient documentation

## 2021-05-01 DIAGNOSIS — Y900 Blood alcohol level of less than 20 mg/100 ml: Secondary | ICD-10-CM | POA: Diagnosis not present

## 2021-05-01 DIAGNOSIS — R45851 Suicidal ideations: Secondary | ICD-10-CM | POA: Diagnosis not present

## 2021-05-01 LAB — CBC WITH DIFFERENTIAL/PLATELET
Abs Immature Granulocytes: 0.03 10*3/uL (ref 0.00–0.07)
Basophils Absolute: 0.1 10*3/uL (ref 0.0–0.1)
Basophils Relative: 1 %
Eosinophils Absolute: 0.3 10*3/uL (ref 0.0–0.5)
Eosinophils Relative: 3 %
HCT: 46.8 % — ABNORMAL HIGH (ref 36.0–46.0)
Hemoglobin: 15.3 g/dL — ABNORMAL HIGH (ref 12.0–15.0)
Immature Granulocytes: 0 %
Lymphocytes Relative: 38 %
Lymphs Abs: 3.9 10*3/uL (ref 0.7–4.0)
MCH: 30.4 pg (ref 26.0–34.0)
MCHC: 32.7 g/dL (ref 30.0–36.0)
MCV: 93 fL (ref 80.0–100.0)
Monocytes Absolute: 1 10*3/uL (ref 0.1–1.0)
Monocytes Relative: 10 %
Neutro Abs: 4.9 10*3/uL (ref 1.7–7.7)
Neutrophils Relative %: 48 %
Platelets: 400 10*3/uL (ref 150–400)
RBC: 5.03 MIL/uL (ref 3.87–5.11)
RDW: 12.7 % (ref 11.5–15.5)
WBC: 10.1 10*3/uL (ref 4.0–10.5)
nRBC: 0 % (ref 0.0–0.2)

## 2021-05-01 LAB — COMPREHENSIVE METABOLIC PANEL
ALT: 13 U/L (ref 0–44)
AST: 16 U/L (ref 15–41)
Albumin: 4 g/dL (ref 3.5–5.0)
Alkaline Phosphatase: 87 U/L (ref 38–126)
Anion gap: 6 (ref 5–15)
BUN: 13 mg/dL (ref 6–20)
CO2: 30 mmol/L (ref 22–32)
Calcium: 9.4 mg/dL (ref 8.9–10.3)
Chloride: 105 mmol/L (ref 98–111)
Creatinine, Ser: 0.82 mg/dL (ref 0.44–1.00)
GFR, Estimated: >60 mL/min (ref >60)
Glucose, Bld: 85 mg/dL (ref 70–99)
Potassium: 3.7 mmol/L (ref 3.5–5.1)
Sodium: 141 mmol/L (ref 135–145)
Total Bilirubin: 0.1 mg/dL — ABNORMAL LOW (ref 0.3–1.2)
Total Protein: 6.7 g/dL (ref 6.5–8.1)

## 2021-05-01 LAB — ETHANOL: Alcohol, Ethyl (B): <10 mg/dL (ref <10)

## 2021-05-01 LAB — RAPID URINE DRUG SCREEN, HOSP PERFORMED
Amphetamines: NOT DETECTED
Barbiturates: NOT DETECTED
Benzodiazepines: NOT DETECTED
Cocaine: POSITIVE — AB
Opiates: NOT DETECTED
Tetrahydrocannabinol: POSITIVE — AB

## 2021-05-01 LAB — RESP PANEL BY RT-PCR (FLU A&B, COVID) ARPGX2
Influenza A by PCR: NEGATIVE
Influenza B by PCR: NEGATIVE
SARS Coronavirus 2 by RT PCR: NEGATIVE

## 2021-05-01 MED ORDER — CYCLOBENZAPRINE HCL 10 MG PO TABS
10.0000 mg | ORAL_TABLET | Freq: Three times a day (TID) | ORAL | Status: DC | PRN
  Administered 2021-05-01: 10 mg via ORAL
  Filled 2021-05-01: qty 1

## 2021-05-01 MED ORDER — LORATADINE 10 MG PO TABS
10.0000 mg | ORAL_TABLET | Freq: Every day | ORAL | Status: DC
  Administered 2021-05-02: 10 mg via ORAL
  Filled 2021-05-01 (×2): qty 1

## 2021-05-01 MED ORDER — METOPROLOL SUCCINATE ER 50 MG PO TB24
25.0000 mg | ORAL_TABLET | Freq: Every day | ORAL | Status: DC
  Administered 2021-05-01 – 2021-05-02 (×2): 25 mg via ORAL
  Filled 2021-05-01 (×2): qty 1

## 2021-05-01 MED ORDER — IBUPROFEN 200 MG PO TABS
400.0000 mg | ORAL_TABLET | Freq: Four times a day (QID) | ORAL | Status: DC | PRN
  Administered 2021-05-02: 400 mg via ORAL
  Filled 2021-05-01: qty 2

## 2021-05-01 MED ORDER — FLUTICASONE PROPIONATE 50 MCG/ACT NA SUSP
1.0000 | Freq: Every day | NASAL | Status: DC | PRN
  Filled 2021-05-01: qty 16

## 2021-05-01 MED ORDER — METOPROLOL SUCCINATE ER 50 MG PO TB24: 25.0000 mg | ORAL_TABLET | Freq: Every day | ORAL | Status: DC

## 2021-05-01 NOTE — ED Notes (Signed)
Pt has sitter at the bedside.  I see that TTS was consulted but other protocol labs, urine and EKG were not ordered.  Chart reviewed and call to TTS to see if there is a reason they would not want this.  Ordered this per protocol.

## 2021-05-01 NOTE — ED Notes (Signed)
As I approached pt to obtain bloodwork, this Clinical research associate overheard her speaking with the tech sitting with her and stating "if no one comes over here by 10pm, I'm leaving. No one has done anything for me or even talked to me. That guy (motioning towards nurses station) ya'll took over for just sat there and did nothing for me from 1pm-6pm."  This writer tried to explain to pt that he was most likely awaiting orders from EDP as none had been placed until RN inquired about whether orders were intentionally not placed. Pt tearful and stating that "that guy made me more suicidal" and continuing to portray dissatisfaction with care received from previous nurse. Consulting civil engineer notified.

## 2021-05-01 NOTE — ED Notes (Signed)
Upon entering room to move pt back to TCU, it was noted that the pt still had all of her belongings. Security was called to wand pt. Pt requesting to keep her phone with her because she is waiting for a call back from the [place] hospital and/or her daughter.  Explained to pt that she cannot keep her cell phone - she is agreeable to having it locked in locker with the rest of her belongings.  Suggested that pt call the [place] and/or her daughter and give them the our ED number so they can contact her. Pt called daughter and gave her the [facility] ED phone number.  Pt has one labeled pt belonging bag and one brown leather purse - both of which were placed in the appropriate locker in [place] and locker locked.

## 2021-05-01 NOTE — ED Provider Notes (Signed)
[facility] DEPT Provider Note   CSN: [id number] Arrival date & time: 05/01/21  1332     History  Chief Complaint  Patient presents with   Suicidal    Whitney Hunt is a 61 y.o. female.  61 yo F with a chief complaint of suicidal ideation.  The patient tells me that this is a chronic problem for her and been going on for at least the past couple years.  Got to the point in the past week or so that its been impacting her job and her boss noted that she was more depressed today than normal and so had confronted her about it and she endorsed that she was suicidal and she was taken here for evaluation.  She used to be on medications for mental health but has not been on this for at least a couple months.  She tells me that she does not have enough money to afford her medications anymore and also does not have enough money to see her doctor in the office.  She denies any overt plan.  She denies any medical complaint denies cough congestion or fever denies chest pain shortness of breath abdominal pain headache neck pain.  The history is provided by the patient.      Home Medications Prior to Admission medications   Medication Sig Start Date End Date Taking? Authorizing Provider  ibuprofen (ADVIL) 200 MG tablet Take 400 mg by mouth every 6 (six) hours as needed (For neck pain).    [provider]  metoprolol succinate (TOPROL-XL) 25 MG 24 hr tablet Take 1 tablet (25 mg total) by mouth daily. 04/01/19 04/14/21  Gareth Morgan, MD      Allergies    Doxycycline    Review of Systems   Review of Systems  Physical Exam Updated Vital Signs BP (!) 186/114 (BP Location: Left Arm)    Pulse 87    Temp 97.9 F (36.6 C) (Oral)    Resp 18    LMP 01/30/2015    SpO2 100%  Physical Exam Vitals and nursing note reviewed.  Constitutional:      General: She is not in acute distress.    Appearance: She is well-developed. She is not diaphoretic.  HENT:     Head:  Normocephalic and atraumatic.  Eyes:     Pupils: Pupils are equal, round, and reactive to light.  Cardiovascular:     Rate and Rhythm: Normal rate and regular rhythm.     Heart sounds: No murmur heard.   No friction rub. No gallop.  Pulmonary:     Effort: Pulmonary effort is normal.     Breath sounds: No wheezing or rales.  Abdominal:     General: There is no distension.     Palpations: Abdomen is soft.     Tenderness: There is no abdominal tenderness.  Musculoskeletal:        General: No tenderness.     Cervical back: Normal range of motion and neck supple.  Skin:    General: Skin is warm and dry.  Neurological:     Mental Status: She is alert and oriented to person, place, and time.  Psychiatric:        Behavior: Behavior normal.    ED Results / Procedures / Treatments   Labs (all labs ordered are listed, but only abnormal results are displayed) Labs Reviewed  RESP PANEL BY RT-PCR (FLU A&B, COVID) [id number]    EKG None  Radiology No results found.  Procedures Procedures    Medications Ordered in ED Medications  metoprolol succinate (TOPROL-XL) 24 hr tablet 25 mg (has no administration in time range)    ED Course/ Medical Decision Making/ A&P                           Medical Decision Making Risk Prescription drug management.   Patient is a 61 y.o. female with a cc of suicidal ideation.  This has been a chronic problem for her by history.  She tells me has been going on for at least a couple years.  She stopped taking her mental medications about 2 months ago.  Had run out and did not have the funds to be able to afford refill.  She denies any obvious plan.  She denies any medical complaint.  I feel she is medically cleared for TTS evaluation.  Patient is mildly hypertensive here.  Looks like she was on metoprolol in the past.  Will restart here for.  We will not restart her mental health medications that she has not been on them for a couple months.  The  patients results and plan were reviewed and discussed.   Any x-rays performed were independently reviewed by myself.   Differential diagnosis were considered with the presenting HPI.  Medications  metoprolol succinate (TOPROL-XL) 24 hr tablet 25 mg (has no administration in time range)    Vitals:   05/01/21 1351  BP: (!) 186/114  Pulse: 87  Resp: 18  Temp: 97.9 F (36.6 C)  TempSrc: Oral  SpO2: 100%    Final diagnoses:  Suicidal ideation          Final Clinical Impression(s) / ED Diagnoses Final diagnoses:  Suicidal ideation    Rx / DC Orders ED Discharge Orders     None         Deno Etienne, DO 05/01/21 1639

## 2021-05-01 NOTE — ED Triage Notes (Signed)
Pt presents with c/o suicidal ideation. Pt reports that she has felt this way for several years but it seems to have recently gotten worse. Pt denies a plan, thoughts only at this time. Pt is cooperative, tearful in triage. Pt reports that she has a hx of anxiety and depression, has taken medicine in the past but currently does not as she cannot afford it.

## 2021-05-02 ENCOUNTER — Other Ambulatory Visit: Payer: Self-pay

## 2021-05-02 ENCOUNTER — Inpatient Hospital Stay
Admission: AD | Admit: 2021-05-02 | Discharge: 2021-05-09 | DRG: 885 | Disposition: A | Discharge status: Home / Self Care | Payer: BC Managed Care – PPO | Source: Intra-hospital | Attending: Psychiatry | Admitting: Psychiatry

## 2021-05-02 ENCOUNTER — Encounter: Payer: Self-pay | Admitting: Psychiatry

## 2021-05-02 DIAGNOSIS — M19042 Primary osteoarthritis, left hand: Secondary | ICD-10-CM | POA: Diagnosis present

## 2021-05-02 DIAGNOSIS — F332 Major depressive disorder, recurrent severe without psychotic features: Secondary | ICD-10-CM | POA: Diagnosis present

## 2021-05-02 DIAGNOSIS — F1290 Cannabis use, unspecified, uncomplicated: Secondary | ICD-10-CM | POA: Diagnosis present

## 2021-05-02 DIAGNOSIS — Z881 Allergy status to other antibiotic agents status: Secondary | ICD-10-CM | POA: Diagnosis not present

## 2021-05-02 DIAGNOSIS — Z9114 Patient's other noncompliance with medication regimen: Secondary | ICD-10-CM | POA: Diagnosis not present

## 2021-05-02 DIAGNOSIS — F1410 Cocaine abuse, uncomplicated: Secondary | ICD-10-CM | POA: Diagnosis present

## 2021-05-02 DIAGNOSIS — M19041 Primary osteoarthritis, right hand: Secondary | ICD-10-CM | POA: Diagnosis present

## 2021-05-02 DIAGNOSIS — R45851 Suicidal ideations: Secondary | ICD-10-CM | POA: Diagnosis present

## 2021-05-02 DIAGNOSIS — F17210 Nicotine dependence, cigarettes, uncomplicated: Secondary | ICD-10-CM | POA: Diagnosis present

## 2021-05-02 DIAGNOSIS — F419 Anxiety disorder, unspecified: Secondary | ICD-10-CM | POA: Diagnosis present

## 2021-05-02 DIAGNOSIS — Z20822 Contact with and (suspected) exposure to covid-19: Secondary | ICD-10-CM | POA: Diagnosis present

## 2021-05-02 DIAGNOSIS — Z23 Encounter for immunization: Secondary | ICD-10-CM

## 2021-05-02 DIAGNOSIS — M1990 Unspecified osteoarthritis, unspecified site: Secondary | ICD-10-CM

## 2021-05-02 DIAGNOSIS — I471 Supraventricular tachycardia: Secondary | ICD-10-CM | POA: Diagnosis present

## 2021-05-02 MED ORDER — LORATADINE 10 MG PO TABS
10.0000 mg | ORAL_TABLET | Freq: Every day | ORAL | Status: DC
  Administered 2021-05-03 – 2021-05-09 (×7): 10 mg via ORAL
  Filled 2021-05-02 (×7): qty 1

## 2021-05-02 MED ORDER — MAGNESIUM HYDROXIDE 400 MG/5ML PO SUSP: 30.0000 mL | Freq: Every day | ORAL | Status: DC | PRN

## 2021-05-02 MED ORDER — METOPROLOL SUCCINATE ER 25 MG PO TB24
25.0000 mg | ORAL_TABLET | Freq: Every day | ORAL | Status: DC
  Administered 2021-05-03 – 2021-05-09 (×7): 25 mg via ORAL
  Filled 2021-05-02 (×7): qty 1

## 2021-05-02 MED ORDER — INFLUENZA VAC SPLIT QUAD 0.5 ML IM SUSY
0.5000 mL | PREFILLED_SYRINGE | INTRAMUSCULAR | Status: AC
  Administered 2021-05-03: 0.5 mL via INTRAMUSCULAR
  Filled 2021-05-02: qty 0.5

## 2021-05-02 MED ORDER — ACETAMINOPHEN 325 MG PO TABS
650.0000 mg | ORAL_TABLET | Freq: Four times a day (QID) | ORAL | Status: DC | PRN
  Administered 2021-05-03 – 2021-05-08 (×6): 650 mg via ORAL
  Filled 2021-05-02 (×7): qty 2

## 2021-05-02 MED ORDER — ALUM & MAG HYDROXIDE-SIMETH 200-200-20 MG/5ML PO SUSP: 30.0000 mL | ORAL | Status: DC | PRN

## 2021-05-02 MED ORDER — VENLAFAXINE HCL ER 37.5 MG PO CP24
37.5000 mg | ORAL_CAPSULE | Freq: Every day | ORAL | Status: DC
  Administered 2021-05-03: 37.5 mg via ORAL
  Filled 2021-05-02: qty 1

## 2021-05-02 MED ORDER — CYCLOBENZAPRINE HCL 10 MG PO TABS: 10.0000 mg | ORAL_TABLET | Freq: Three times a day (TID) | ORAL | Status: DC | PRN

## 2021-05-02 MED ORDER — FLUTICASONE PROPIONATE 50 MCG/ACT NA SUSP
1.0000 | Freq: Every day | NASAL | Status: DC | PRN
  Filled 2021-05-02: qty 16

## 2021-05-02 MED ORDER — TRAZODONE HCL 50 MG PO TABS
100.0000 mg | ORAL_TABLET | Freq: Every evening | ORAL | Status: DC | PRN
  Administered 2021-05-02 – 2021-05-08 (×7): 100 mg via ORAL
  Filled 2021-05-02 (×7): qty 2

## 2021-05-02 MED ORDER — IBUPROFEN 200 MG PO TABS: 400.0000 mg | ORAL_TABLET | Freq: Four times a day (QID) | ORAL | Status: DC | PRN

## 2021-05-02 NOTE — ED Notes (Signed)
Report given to [facility]  °

## 2021-05-02 NOTE — BH Assessment (Addendum)
BHH Assessment Progress Note   Per Maxie Barb, NP, this pt requires psychiatric hospitalization.  Dr Toni Amend has accepted to [facility] Rm L26.  Pt has signed Voluntary Admission and Consent for Treatment, as well as Consent to Release Information to no one, and signed forms have been faxed to [phone].  EDP Rolan Bucco, MD and pt's nurse, Gabriel Earing, have been notified, and Taquita agrees to call report to [phone].  Pt is to be transported via [employer].     Doylene Canning, [place] Behavioral Health Coordinator [phone]  Addendum:  After signing consent, pt equivocated.  Dr Fredderick Phenix finds that pt meets criteria for IVC which she has initiated and magistrate has upheld.  IVC documents have been faxed to [facility].  Pt is now to be transported via [facility].  Gabriel Earing has been notified of change of plan.  Doylene Canning, [place] Behavioral Health Coordinator [phone]

## 2021-05-02 NOTE — ED Notes (Signed)
Patient calm and cooperative.

## 2021-05-02 NOTE — ED Notes (Signed)
Transport called [employer]

## 2021-05-02 NOTE — ED Notes (Signed)
Psych evaluation complete.

## 2021-05-02 NOTE — ED Provider Notes (Addendum)
Emergency Medicine Observation Re-evaluation Note  Whitney Hunt is a 61 y.o. female, seen on rounds today.  Pt initially presented to the ED for complaints of Suicidal Currently, the patient is sitting up in bed.  Physical Exam  BP (!) 138/98 (BP Location: Right Arm)    Pulse 79    Temp 97.8 F (36.6 C) (Oral)    Resp 18    LMP 01/30/2015    SpO2 96%  Physical Exam General: sitting up, NAD Cardiac: normal rate Lungs: no increased WOB Psych: calm  ED Course / MDM  EKG:   I have reviewed the labs performed to date as well as medications administered while in observation.  Recent changes in the last 24 hours include none.  Plan  Current plan is for placement.  Whitney Hunt is not under involuntary commitment.  11:14 pt has been accepted to [facility]  Has become upset about not going to [facility]. Given this and concern for possible issues during transport, IVC papers initiated.  PT is still expressing SI.     Malvin Johns, MD 05/02/21 [id number]    Malvin Johns, MD 05/02/21 1115

## 2021-05-02 NOTE — BH Assessment (Signed)
Patient has been accepted to [facility].  Accepting physician is Dr. Toni Amend.  Attending  Physician will be Dr. Marlou Porch.  Patient has been assigned to room L26, by [facility] Charge Nurse Marchelle Folks.   Call report to [phone].  Representative/Transfer Coordinator is Lilyan Gilford Patient pre-admitted by [facility] Patient Access Sue Lush)  [facility] ER Staff Maisie Fus H.,, Disposition TTS) made aware of acceptance.

## 2021-05-02 NOTE — Progress Notes (Signed)
Patient is A&Ox4. Patient is tearful and anxious. Patient given Trazodone 100 mg at 2107 with effect. Pt denies SI, HI, and AVH. Pt remains safe on the unit at this time.
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
## 2021-05-02 NOTE — ED Notes (Signed)
Sheriff called for transport to [facility]

## 2021-05-02 NOTE — Progress Notes (Signed)
Patient admitted IVC to [facility] from [place] with diagnosis of worsening depression. Patient presents to unit ambulatory and A&Ox4.  Patient states, "I want to kill myself, ive been going through a lot lately and nobody gives a fuck." Patient's affect is agitated, anxious, depressed, and flat. Speech is normal and thoughts are organized. Patient endorses depression and anxiety stating her main stressors are her husband, her job, and her life in general.  Patient currently endorses suicidal ideations with no plan. She denies homicidal ideations, audio or visual hallucinations and verbally contracts for safety on unit.  She denies pain at this time. Denies incontinence. Patient reports smoking cigarettes, marijuana. She states that she does endorse ETOH use "a couple shots of vodka per week." Patient reports living  with her husband whom is no support to her. States her daughter is her main support. States are goals are to focus, comply with meds, and control her emotions. She says she is really good at her job with [employer] and she is good with cats.  Emotional support and reassurance provided throughout admission intake. Afterwards, oriented patient to unit, room and call light, reviewed POC with all questions answered and understanding verbalzied. Denies any needs at this time. Will continue to monitor with ongoing Q 15 minute safety checks per unit protocol.

## 2021-05-02 NOTE — ED Notes (Signed)
Pt called out asking for something for a headache.

## 2021-05-02 NOTE — Group Note (Signed)
LCSW Group Therapy Note  Group Date: 05/02/2021 Start Time: 1300 End Time: 1400   Type of Therapy and Topic:  Group Therapy - Healthy vs Unhealthy Coping Skills  Participation Level:  Active   Description of Group The focus of this group was to determine what unhealthy coping techniques typically are used by group members and what healthy coping techniques would be helpful in coping with various problems. Patients were guided in becoming aware of the differences between healthy and unhealthy coping techniques. Patients were asked to identify 2-3 healthy coping skills they would like to learn to use more effectively.  Therapeutic Goals Patients learned that coping is what human beings do all day long to deal with various situations in their lives Patients defined and discussed healthy vs unhealthy coping techniques Patients identified their preferred coping techniques and identified whether these were healthy or unhealthy Patients determined 2-3 healthy coping skills they would like to become more familiar with and use more often. Patients provided support and ideas to each other   Summary of Patient Progress:  Patient was present for the entirety of the group session. Patient was an active listener and participated in the topic of discussion, provided helpful advice to others, and added nuance to topic of conversation. Patient shared that she can find herself drinking alcohol instead of engaging in healthy coping strategies. States she could start baking and exercising as an alternative.    Therapeutic Modalities Cognitive Behavioral Therapy Motivational Interviewing  Almedia Balls 05/02/2021  3:48 PM

## 2021-05-03 DIAGNOSIS — F332 Major depressive disorder, recurrent severe without psychotic features: Secondary | ICD-10-CM | POA: Diagnosis not present

## 2021-05-03 DIAGNOSIS — F1410 Cocaine abuse, uncomplicated: Secondary | ICD-10-CM

## 2021-05-03 DIAGNOSIS — I471 Supraventricular tachycardia: Secondary | ICD-10-CM

## 2021-05-03 DIAGNOSIS — M1990 Unspecified osteoarthritis, unspecified site: Secondary | ICD-10-CM

## 2021-05-03 MED ORDER — HYDROXYZINE HCL 25 MG PO TABS
50.0000 mg | ORAL_TABLET | Freq: Four times a day (QID) | ORAL | Status: DC | PRN
  Administered 2021-05-03 – 2021-05-08 (×6): 50 mg via ORAL
  Filled 2021-05-03 (×6): qty 2

## 2021-05-03 MED ORDER — ESCITALOPRAM OXALATE 10 MG PO TABS
10.0000 mg | ORAL_TABLET | Freq: Every day | ORAL | Status: DC
  Administered 2021-05-03 – 2021-05-05 (×3): 10 mg via ORAL
  Filled 2021-05-03 (×3): qty 1

## 2021-05-03 NOTE — BHH Suicide Risk Assessment (Signed)
[facility] Admission Suicide Risk Assessment   Nursing information obtained from:    Demographic factors:  Caucasian Current Mental Status:  Suicidal ideation indicated by patient Loss Factors:  NA Historical Factors:  NA Risk Reduction Factors:  Employed  Total Time spent with patient: 1 hour Principal Problem: MDD (major depressive disorder), recurrent severe, without psychosis ([place]) Diagnosis:  Principal Problem:   MDD (major depressive disorder), recurrent severe, without psychosis ([place]) Active Problems:   Cocaine abuse ([place])   Arthritis   Paroxysmal supraventricular tachycardia ([place])  Subjective Data: Patient seen and chart reviewed.  61 year old woman admitted to the hospital after presenting to the emergency room with multiple symptoms of depression including suicidal thoughts.  Patient is endorsing depressed mood hopelessness and tearfulness and says that she "does not want to live" but she also says several times that she does not actually wish to do anything to harm herself or act on killing herself.  Not reporting any psychotic symptoms.  She is open to treatment and has had no behavior problems since presentation  Continued Clinical Symptoms:  Alcohol Use Disorder Identification Test Final Score (AUDIT): 3 The "Alcohol Use Disorders Identification Test", Guidelines for Use in Primary Care, Second Edition.  World Pharmacologist [facility]). Score between 0-7:  no or low risk or alcohol related problems. Score between 8-15:  moderate risk of alcohol related problems. Score between 16-19:  high risk of alcohol related problems. Score 20 or above:  warrants further diagnostic evaluation for alcohol dependence and treatment.   CLINICAL FACTORS:   Depression:   Comorbid alcohol abuse/dependence   Musculoskeletal: Strength & Muscle Tone: within normal limits Gait & Station: normal Patient leans: N/A  Psychiatric Specialty Exam:  Presentation  General Appearance: Appropriate for  Environment; Casual  Eye Contact:Fair  Speech:Clear and Coherent; Normal Rate; Other (comment) (irritable tone of voice)  Speech Volume:Normal  Handedness:Left   Mood and Affect  Mood:Irritable  Affect:Appropriate; Congruent; Other (comment) (angry)   Thought Process  Thought Processes:Coherent; Goal Directed; Linear  Descriptions of Associations:Intact  Orientation:Full (Time, Place and Person)  Thought Content:WDL; Logical  History of Schizophrenia/Schizoaffective disorder:No  Duration of Psychotic Symptoms:No data recorded Hallucinations:No data recorded Ideas of Reference:None  Suicidal Thoughts:No data recorded Homicidal Thoughts:No data recorded  Sensorium  Memory:Immediate Good; Recent Good; Remote Fair  Judgment:Fair  Insight:Fair   Executive Functions  Concentration:Fair  Attention Span:Fair  [place]  Language:Good   Psychomotor Activity  Psychomotor Activity:No data recorded  Assets  Assets:Communication Skills; Desire for Improvement; Housing; Physical Health; Social Support; Transportation   Sleep  Sleep:No data recorded   Physical Exam: Physical Exam Vitals and nursing note reviewed.  Constitutional:      Appearance: Normal appearance.  HENT:     Head: Normocephalic and atraumatic.     Mouth/Throat:     Pharynx: Oropharynx is clear.  Eyes:     Pupils: Pupils are equal, round, and reactive to light.  Cardiovascular:     Rate and Rhythm: Normal rate and regular rhythm.  Pulmonary:     Effort: Pulmonary effort is normal.     Breath sounds: Normal breath sounds.  Abdominal:     General: Abdomen is flat.     Palpations: Abdomen is soft.  Musculoskeletal:        General: Normal range of motion.       Arms:  Skin:    General: Skin is warm and dry.  Neurological:     General: No focal deficit present.  Mental Status: She is alert. Mental status is at baseline.  Psychiatric:         Attention and Perception: Attention normal.        Mood and Affect: Mood is anxious and depressed. Affect is tearful.        Speech: Speech normal.        Behavior: Behavior is agitated. Behavior is not aggressive.        Thought Content: Thought content includes suicidal ideation. Thought content does not include suicidal plan.        Cognition and Memory: Cognition normal.        Judgment: Judgment is impulsive.   Review of Systems  Constitutional: Negative.   HENT: Negative.    Eyes: Negative.   Respiratory: Negative.    Cardiovascular: Negative.   Gastrointestinal: Negative.   Musculoskeletal:  Positive for joint pain.  Skin: Negative.   Neurological: Negative.   Psychiatric/Behavioral:  Positive for depression, substance abuse and suicidal ideas. Negative for hallucinations and memory loss. The patient is nervous/anxious. The patient does not have insomnia.   Blood pressure 135/87, pulse 88, temperature 97.7 F (36.5 C), temperature source Oral, resp. rate 18, height 5\' 3"  (1.6 m), weight 57.6 kg, last menstrual period 01/30/2015, SpO2 98 %. Body mass index is 22.5 kg/m.   COGNITIVE FEATURES THAT CONTRIBUTE TO RISK:  Polarized thinking    SUICIDE RISK:   Mild:  Suicidal ideation of limited frequency, intensity, duration, and specificity.  There are no identifiable plans, no associated intent, mild dysphoria and related symptoms, good self-control (both objective and subjective assessment), few other risk factors, and identifiable protective factors, including available and accessible social support.  PLAN OF CARE: Continue 15-minute checks.  Reviewed treatment plan with patient.  Initiate medications for depression and anxiety.  Encourage patient to speak with staff and participate in group activities and treatment.  I certify that inpatient services furnished can reasonably be expected to improve the patient's condition.   Alethia Berthold, MD 05/03/2021, 1:46 PM

## 2021-05-03 NOTE — Progress Notes (Signed)
Patient is calm and cooperative. Pt denies SI, HI and AVH. Pt reported feeling less anxious than yesterday. Pt c/o L. Hip and R. Wrist pain rated 8/10. Tylenol given as per MAR orders. Pt remains safe on the unit at this time.

## 2021-05-03 NOTE — Plan of Care (Signed)
Patient presents Tearful and Anxious during assessment.  Pt did endorse SI but denies a plan.  Pt denies need for hospitalization. "I just don't want to be here but I know I need to be here".  "I need something to make me feel numb because I hate being alive".  Emotional support and encouragement given.  Pt did comply with scheduled medications.  Pt denies any adverse reactions from medications given. Pt did participate in therapeutic milieu.  Pt ask to verbalize healthy coping skills learned from group therapy states "No I can't remember I only attended one group".  Pt did have adequate intake and restful sleep. Informed pt to notify nursing staff if any needs arise.  Q 15 minute safety checks in place. Plan of care will continue.    Problem: Education: Goal: Knowledge of [place] [facility] Education information/materials will improve Reactivated Goal: Emotional status will improve Reactivated Goal: Mental status will improve Reactivated Goal: Verbalization of understanding the information provided will improve Reactivated   Problem: Activity: Goal: Interest or engagement in activities will improve Reactivated Goal: Sleeping patterns will improve Reactivated  Problem: Coping: Goal: Ability to verbalize frustrations and anger appropriately will improve Reactivated Goal: Ability to demonstrate self-control will improve Reactivated   Problem: Health Behavior/Discharge Planning: Goal: Identification of resources available to assist in meeting health care needs will improve Reactivated Goal: Compliance with treatment plan for underlying cause of condition will improve Reactivated   Problem: Safety: Goal: Periods of time without injury will increase Reactivated
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
## 2021-05-04 DIAGNOSIS — F332 Major depressive disorder, recurrent severe without psychotic features: Secondary | ICD-10-CM | POA: Diagnosis not present

## 2021-05-04 MED ORDER — NICOTINE POLACRILEX 2 MG MT GUM
2.0000 mg | CHEWING_GUM | OROMUCOSAL | Status: DC | PRN
  Administered 2021-05-04 – 2021-05-09 (×19): 2 mg via ORAL
  Filled 2021-05-04 (×21): qty 1

## 2021-05-04 NOTE — Plan of Care (Signed)
Patient is alert and oriented times 4. Mood and affect appropriate. No c/o pain or discomfort. She denies SI, HI, and AVH. Also denies feelings of anxiety and depression at this time. States she slept good last night. Morning meds given whole by mouth W/O difficulty. Ate breakfast in day room- appetite good. Patient remains on unit with Q15 minute checks in place.   Problem: Education: Goal: Knowledge of  General Education information/materials will improve Outcome: Progressing Goal: Emotional status will improve Outcome: Progressing Goal: Mental status will improve Outcome: Progressing Goal: Verbalization of understanding the information provided will improve Outcome: Progressing   Problem: Activity: Goal: Interest or engagement in activities will improve Outcome: Progressing Goal: Sleeping patterns will improve Outcome: Progressing   Problem: Coping: Goal: Ability to verbalize frustrations and anger appropriately will improve Outcome: Progressing Goal: Ability to demonstrate self-control will improve Outcome: Progressing   Problem: Health Behavior/Discharge Planning: Goal: Identification of resources available to assist in meeting health care needs will improve Outcome: Progressing Goal: Compliance with treatment plan for underlying cause of condition will improve Outcome: Progressing   Problem: Safety: Goal: Periods of time without injury will increase Outcome: Progressing
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
## 2021-05-04 NOTE — [facility] Suicide Risk Assessment (Signed)
[facility] INPATIENT:  Family/Significant Other Suicide Prevention Education  Suicide Prevention Education:  Contact Attempts: Hania Cerone (husband) [phone] has been identified by the patient as the family member/significant other with whom the patient will be residing, and identified as the person(s) who will aid the patient in the event of a mental health crisis.  With written consent from the patient, two attempts were made to provide suicide prevention education, prior to and/or following the patient's discharge.  We were unsuccessful in providing suicide prevention education.  A suicide education pamphlet was given to the patient to share with family/significant other.  Date and time of first attempt: 05/04/21 11:02AM Date and time of second attempt: A second attempt is needed.   Ileana Ladd Tanveer Brammer 05/04/2021, 11:01 AM
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
## 2021-05-04 NOTE — Group Note (Signed)
LCSW Group Therapy Note  Group Date: 05/04/2021 Start Time: 1310 End Time: 1350   Type of Therapy and Topic:  Group Therapy - How To Cope with Nervousness about Discharge   Participation Level:  Minimal   Description of Group This process group involved identification of patients' feelings about discharge. Some of them are scheduled to be discharged soon, while others are new admissions, but each of them was asked to share thoughts and feelings surrounding discharge from the hospital. One common theme was that they are excited at the prospect of going home, while another was that many of them are apprehensive about sharing why they were hospitalized. Patients were given the opportunity to discuss these feelings with their peers in preparation for discharge.  Therapeutic Goals  Patient will identify their overall feelings about pending discharge. Patient will think about how they might proactively address issues that they believe will once again arise once they get home (i.e. with parents). Patients will participate in discussion about having hope for change.   Summary of Patient Progress:  Patient was an active listener and participated in the topic of discussion. Patient shared that she is feeling better than she felt yesterday. Patient shared her husband visited her yesterday and they had a "good visit", sharing despite relationship conflict, they have always been friends even before getting married. Patient became upset when hearing another group member share about their personal situation and left group early.    Therapeutic Modalities Cognitive Behavioral Therapy   Marletta Lor 05/04/2021  2:57 PM

## 2021-05-05 DIAGNOSIS — F332 Major depressive disorder, recurrent severe without psychotic features: Secondary | ICD-10-CM | POA: Diagnosis not present

## 2021-05-05 MED ORDER — FLUOXETINE HCL 20 MG PO CAPS
20.0000 mg | ORAL_CAPSULE | Freq: Every day | ORAL | Status: DC
  Administered 2021-05-05 – 2021-05-09 (×5): 20 mg via ORAL
  Filled 2021-05-05 (×5): qty 1

## 2021-05-05 MED ORDER — RISPERIDONE 1 MG PO TABS
0.5000 mg | ORAL_TABLET | ORAL | Status: DC
  Administered 2021-05-05 – 2021-05-09 (×9): 0.5 mg via ORAL
  Filled 2021-05-05 (×8): qty 1

## 2021-05-05 NOTE — Progress Notes (Signed)
Patient presents as anxious and is undergoing a crying spell at the beginning of the shift. Pt denies SI, HI, and AVH. Pt reports she is feeling anxious due to not being able to watch a certain program during the day. Pt reports L. Hip pain rated 8/10. Pt given PRNs as per MAR orders with effect. Pt was able to stay in the dayroom and speak to her daughter on the phone. Pt was calm and cooperative. Pt is medication compliant. Pt remains safe on the unit at this time.

## 2021-05-05 NOTE — BH IP Treatment Plan (Signed)
Interdisciplinary Treatment and Diagnostic Plan Update  05/05/2021 Time of Session: 10:13AM Whitney Hunt MRN: [id number]  Principal Diagnosis: MDD (major depressive disorder), recurrent severe, without psychosis ([place])  Secondary Diagnoses: Principal Problem:   MDD (major depressive disorder), recurrent severe, without psychosis ([place]) Active Problems:   Cocaine abuse ([place])   Arthritis   Paroxysmal supraventricular tachycardia ([place])   Current Medications:  Current Facility-Administered Medications  Medication Dose Route Frequency Provider Last Rate Last Admin   acetaminophen (TYLENOL) tablet 650 mg  650 mg Oral Q6H PRN Parks Ranger, DO   650 mg at 05/04/21 1956   alum & mag hydroxide-simeth (MAALOX/MYLANTA) 200-200-20 MG/5ML suspension 30 mL  30 mL Oral Q4H PRN Parks Ranger, DO       cyclobenzaprine (FLEXERIL) tablet 10 mg  10 mg Oral TID PRN Parks Ranger, DO       escitalopram (LEXAPRO) tablet 10 mg  10 mg Oral Daily Clapacs, John T, MD   10 mg at 05/05/21 0904   fluticasone (FLONASE) 50 MCG/ACT nasal spray 1 spray  1 spray Each Nare Daily PRN Parks Ranger, DO       hydrOXYzine (ATARAX) tablet 50 mg  50 mg Oral Q6H PRN Clapacs, John T, MD   50 mg at 05/05/21 8937   ibuprofen (ADVIL) tablet 400 mg  400 mg Oral Q6H PRN Parks Ranger, DO       loratadine (CLARITIN) tablet 10 mg  10 mg Oral Daily Parks Ranger, DO   10 mg at 05/05/21 3428   magnesium hydroxide (MILK OF MAGNESIA) suspension 30 mL  30 mL Oral Daily PRN Parks Ranger, DO       metoprolol succinate (TOPROL-XL) 24 hr tablet 25 mg  25 mg Oral Daily Parks Ranger, DO   25 mg at 05/05/21 7681   nicotine polacrilex (NICORETTE) gum 2 mg  2 mg Oral PRN Clapacs, Madie Reno, MD   2 mg at 05/05/21 0906   traZODone (DESYREL) tablet 100 mg  100 mg Oral QHS PRN Parks Ranger, DO   100 mg at 05/04/21 1932   PTA Medications: Medications Prior to Admission   Medication Sig Dispense Refill Last Dose   cetirizine (ZYRTEC) 10 MG tablet Take 10 mg by mouth daily as needed for allergies.      cyclobenzaprine (FLEXERIL) 10 MG tablet Take 10 mg by mouth 3 (three) times daily as needed for muscle spasms.      fluticasone (FLONASE) 50 MCG/ACT nasal spray Place 1 spray into both nostrils daily as needed for allergies.      ibuprofen (ADVIL) 200 MG tablet Take 400 mg by mouth every 6 (six) hours as needed (For neck pain).      metoprolol succinate (TOPROL-XL) 25 MG 24 hr tablet Take 1 tablet (25 mg total) by mouth daily. 30 tablet 0    venlafaxine XR (EFFEXOR-XR) 37.5 MG 24 hr capsule Take 37.5 mg by mouth daily with breakfast.       Patient Stressors:    Patient Strengths:    Treatment Modalities: Medication Management, Group therapy, Case management,  1 to 1 session with clinician, Psychoeducation, Recreational therapy.   Physician Treatment Plan for Primary Diagnosis: MDD (major depressive disorder), recurrent severe, without psychosis ([place]) Long Term Goal(s): Improvement in symptoms so as ready for discharge   Short Term Goals: Compliance with prescribed medications will improve Ability to identify triggers associated with substance abuse/mental health issues will improve Ability to verbalize feelings will  improve Ability to disclose and discuss suicidal ideas  Medication Management: Evaluate patient's response, side effects, and tolerance of medication regimen.  Therapeutic Interventions: 1 to 1 sessions, Unit Group sessions and Medication administration.  Evaluation of Outcomes: Not Met  Physician Treatment Plan for Secondary Diagnosis: Principal Problem:   MDD (major depressive disorder), recurrent severe, without psychosis ([place]) Active Problems:   Cocaine abuse ([place])   Arthritis   Paroxysmal supraventricular tachycardia ([place])  Long Term Goal(s): Improvement in symptoms so as ready for discharge   Short Term Goals: Compliance with  prescribed medications will improve Ability to identify triggers associated with substance abuse/mental health issues will improve Ability to verbalize feelings will improve Ability to disclose and discuss suicidal ideas     Medication Management: Evaluate patient's response, side effects, and tolerance of medication regimen.  Therapeutic Interventions: 1 to 1 sessions, Unit Group sessions and Medication administration.  Evaluation of Outcomes: Not Met   RN Treatment Plan for Primary Diagnosis: MDD (major depressive disorder), recurrent severe, without psychosis ([place]) Long Term Goal(s): Knowledge of disease and therapeutic regimen to maintain health will improve  Short Term Goals: Ability to remain free from injury will improve, Ability to verbalize frustration and anger appropriately will improve, Ability to demonstrate self-control, Ability to participate in decision making will improve, Ability to verbalize feelings will improve, Ability to disclose and discuss suicidal ideas, Ability to identify and develop effective coping behaviors will improve, and Compliance with prescribed medications will improve  Medication Management: RN will administer medications as ordered by provider, will assess and evaluate patient's response and provide education to patient for prescribed medication. RN will report any adverse and/or side effects to prescribing provider.  Therapeutic Interventions: 1 on 1 counseling sessions, Psychoeducation, Medication administration, Evaluate responses to treatment, Monitor vital signs and CBGs as ordered, Perform/monitor CIWA, COWS, AIMS and Fall Risk screenings as ordered, Perform wound care treatments as ordered.  Evaluation of Outcomes: Not Met   LCSW Treatment Plan for Primary Diagnosis: MDD (major depressive disorder), recurrent severe, without psychosis ([place]) Long Term Goal(s): Safe transition to appropriate next level of care at discharge, Engage patient in  therapeutic group addressing interpersonal concerns.  Short Term Goals: Engage patient in aftercare planning with referrals and resources, Increase social support, Increase ability to appropriately verbalize feelings, Increase emotional regulation, Facilitate acceptance of mental health diagnosis and concerns, and Identify triggers associated with mental health/substance abuse issues  Therapeutic Interventions: Assess for all discharge needs, 1 to 1 time with Social worker, Explore available resources and support systems, Assess for adequacy in community support network, Educate family and significant other(s) on suicide prevention, Complete Psychosocial Assessment, Interpersonal group therapy.  Evaluation of Outcomes: Not Met   Progress in Treatment: Attending groups: Yes. Participating in groups: Yes. Taking medication as prescribed: Yes. Toleration medication: Yes. Family/Significant other contact made: Yes, individual(s) contacted:  pt's husband Patient understands diagnosis: Yes. Discussing patient identified problems/goals with staff: Yes. Medical problems stabilized or resolved: Yes. Denies suicidal/homicidal ideation: Yes. Issues/concerns per patient self-inventory: No. Other: None  New problem(s) identified: No, Describe:  None  New Short Term/Long Term Goal(s):Patient to work towards medication management for mood stabilization; elimination of SI thoughts; development of comprehensive mental wellness plan.  Patient Goals:  "to fix this" pt referring to her frequent crying  Discharge Plan or Barriers: CSW will assist pt in development of appropriate discharge/aftercare plan.   Reason for Continuation of Hospitalization: Depression Medication stabilization Suicidal ideation  Estimated Length of Stay: 1-7 days  Scribe for Treatment Team: Saralynn Langhorst A [place], Latanya Presser 05/05/2021 10:13 AM

## 2021-05-05 NOTE — Progress Notes (Signed)
Recreation Therapy Notes  Date: 05/05/2021   Time: 1:20 pm   Location: Court yard    Behavioral response: Appropriate   Intervention Topic:  Leisure     Discussion/Intervention:  Group content today was focused on leisure. The group defined what leisure is and some positive leisure activities they participate in. Individuals identified the difference between good and bad leisure. Participants expressed how they feel after participating in the leisure of their choice. The group discussed how they go about picking a leisure activity and if others are involved in their leisure activities. The patient stated how many leisure activities they have to choose from and reasons why it is important to have leisure time. Individuals participated in the intervention Exploration of Leisure where they had a chance to identify new leisure activities as well as benefits of leisure. Clinical Observations/Feedback: Patient came to group and was engaged and social with peers and staff. Individual participated in the intervention and was able to focus on the topic at hand.  Riyan Gavina LRT/CTRS         Arad Burston 05/05/2021 2:48 PM

## 2021-05-05 NOTE — Progress Notes (Signed)
Recreation Therapy Notes  INPATIENT RECREATION TR PLAN  Patient Details Name: Whitney Hunt MRN: [id number] DOB: [birth year] Today's Date: 05/05/2021  Rec Therapy Plan Is patient appropriate for Therapeutic Recreation?: Yes Treatment times per week: at least 3 Estimated Length of Stay: 5-7 days TR Treatment/Interventions: Group participation (Comment)  Discharge Criteria Pt will be discharged from therapy if:: Discharged Treatment plan/goals/alternatives discussed and agreed upon by:: Patient/family  Discharge Summary     Deamber Buckhalter 05/05/2021, 2:39 PM

## 2021-05-05 NOTE — Plan of Care (Signed)
Patient is alert and oriented times 4. Patient has crying spells at times. No c/o pain or discomfort. She denies SI, HI, and AVH. Also denies feelings of anxiety and depression at this time. States she slept good last night- like a rock. Morning meds given whole by mouth W/O difficulty. Ate breakfast in day room- appetite good. Patient remains on unit with Q15 minute checks in place.    Problem: Education: Goal: Knowledge of [place] [facility] Education information/materials will improve Outcome: Progressing Goal: Emotional status will improve Outcome: Progressing Goal: Mental status will improve Outcome: Progressing Goal: Verbalization of understanding the information provided will improve Outcome: Progressing   Problem: Activity: Goal: Interest or engagement in activities will improve Outcome: Progressing Goal: Sleeping patterns will improve Outcome: Progressing   Problem: Coping: Goal: Ability to verbalize frustrations and anger appropriately will improve Outcome: Progressing Goal: Ability to demonstrate self-control will improve Outcome: Progressing   Problem: Health Behavior/Discharge Planning: Goal: Identification of resources available to assist in meeting health care needs will improve Outcome: Progressing Goal: Compliance with treatment plan for underlying cause of condition will improve Outcome: Progressing   Problem: Safety: Goal: Periods of time without injury will increase Outcome: Progressing

## 2021-05-05 NOTE — Progress Notes (Signed)
Recreation Therapy Notes  INPATIENT RECREATION THERAPY ASSESSMENT  Patient Details Name: Whitney Hunt MRN: [id number] DOB: 05-21-1960 Today's Date: 05/05/2021       Information Obtained From: Patient  Able to Participate in Assessment/Interview: Yes  Patient Presentation: Responsive  Reason for Admission (Per Patient): Active Symptoms  Patient Stressors:    Coping Skills:   Sports, Art, Doctor, hospital, Other (Comment) (Cook/Bake)  Leisure Interests (2+):  Sports - Basketball, Sports - Psychiatric nurse, Sports - Art gallery manager, Art - Mudlogger, Individual - TV (work)  Frequency of Recreation/Participation: Pharmacist, community Resources:  Yes  Community Resources:  [employer], Engineer, petroleum  Current Use: No  If no, Barriers?: Other (Comment) (COVID)  Expressed Interest in [employer] Information:    [place] of Residence:  [place]  Patient Main Form of Transportation: Car  Patient Strengths:  Helping others, Make people smile, laugh  Patient Identified Areas of Improvement:  Get back to me.  Patient Goal for Hospitalization:  To get back on track.  Current SI (including self-harm):  No  Current HI:  No  Current AVH: No  Staff Intervention Plan: Group Attendance, Collaborate with Interdisciplinary Treatment Team  Consent to Intern Participation: N/A  Lavonna Lampron 05/05/2021, 2:36 PM

## 2021-05-05 NOTE — Group Note (Signed)
[facility] LCSW Group Therapy Note    Group Date: 05/05/2021 Start Time: 1415 End Time: 1500  Type of Therapy and Topic:  Group Therapy:  Overcoming Obstacles  Participation Level:  [facility] PARTICIPATION LEVEL: Active  Mood:  Description of Group:   In this group patients will be encouraged to explore what they see as obstacles to their own wellness and recovery. They will be guided to discuss their thoughts, feelings, and behaviors related to these obstacles. The group will process together ways to cope with barriers, with attention given to specific choices patients can make. Each patient will be challenged to identify changes they are motivated to make in order to overcome their obstacles. This group will be process-oriented, with patients participating in exploration of their own experiences as well as giving and receiving support and challenge from other group members.  Therapeutic Goals: 1. Patient will identify personal and current obstacles as they relate to admission. 2. Patient will identify barriers that currently interfere with their wellness or overcoming obstacles.  3. Patient will identify feelings, thought process and behaviors related to these barriers. 4. Patient will identify two changes they are willing to make to overcome these obstacles:    Summary of Patient Progress   Patient was present for the entirety of the session. Patient was an active listener and participated in the topic of discussion and had a normal appropriate mood. She stated that she was feeling better this afternoon and believes that the medication is already helping. She stated that her tearfulness started to affect her job performance and that currently was her biggest obstacle. She said that she could practice breathing and use that to assist when she feels overwhelmed, even though remembering in the moment is difficult. CSW discussed that "reframing" as a  cognitive coping skill to her negative thoughts and feelings as well and demonstrated with patient.    Therapeutic Modalities:   Cognitive Behavioral Therapy Solution Focused Therapy Motivational Interviewing Relapse Prevention Therapy   Mysty Kielty A [place], LCSWA

## 2021-05-05 NOTE — Progress Notes (Signed)
[facility] MD Progress Note  05/05/2021 10:33 AM Whitney Hunt  MRN:  [id number] Subjective: This is my first meeting with Whitney Hunt.  She works at [employer] and they have reassigned her job duties and this made her very paranoid that she was not doing her job correctly.  She is tearful during the interview.  She states that she has been depressed for the last month and having suicidal thoughts.  She cannot control her emotions.  She has been on Effexor in the past but states that her doctor has been changing it.  I talked to her about Prozac and she has not been on it before.  Principal Problem: MDD (major depressive disorder), recurrent severe, without psychosis ([place]) Diagnosis: Principal Problem:   MDD (major depressive disorder), recurrent severe, without psychosis ([place]) Active Problems:   Cocaine abuse ([place])   Arthritis   Paroxysmal supraventricular tachycardia ([place])  Total Time spent with patient: 15 minutes  Past Psychiatric History: No  Past Medical History: History reviewed. No pertinent past medical history.  Past Surgical History:  Procedure Laterality Date   TUBAL LIGATION  2010   Family History:  Family History  Problem Relation Age of Onset   Cervical cancer Sister 54   Colon cancer Neg Hx    Stomach cancer Neg Hx    Rectal cancer Neg Hx     Social History:  Social History   Substance and Sexual Activity  Alcohol Use No     Social History   Substance and Sexual Activity  Drug Use No    Social History   Socioeconomic History   Marital status: Married    Spouse name: Not on file   Number of children: Not on file   Years of education: Not on file   Highest education level: Not on file  Occupational History   Not on file  Tobacco Use   Smoking status: Every Day    Packs/day: 0.50    Types: Cigarettes   Smokeless tobacco: Never  Vaping Use   Vaping Use: Never used  Substance and Sexual Activity   Alcohol use: No   Drug use: No   Sexual activity: Yes  Other  Topics Concern   Not on file  Social History Narrative   Not on file   Social Determinants of Health   Financial Resource Strain: Not on file  Food Insecurity: Not on file  Transportation Needs: Not on file  Physical Activity: Not on file  Stress: Not on file  Social Connections: Not on file   Additional Social History:                         Sleep: Good  Appetite:  Good  Current Medications: Current Facility-Administered Medications  Medication Dose Route Frequency Provider Last Rate Last Admin   acetaminophen (TYLENOL) tablet 650 mg  650 mg Oral Q6H PRN Parks Ranger, DO   650 mg at 05/04/21 1956   alum & mag hydroxide-simeth (MAALOX/MYLANTA) 200-200-20 MG/5ML suspension 30 mL  30 mL Oral Q4H PRN Parks Ranger, DO       cyclobenzaprine (FLEXERIL) tablet 10 mg  10 mg Oral TID PRN Parks Ranger, DO       FLUoxetine (PROZAC) capsule 20 mg  20 mg Oral Daily Parks Ranger, DO       fluticasone [facility]) 50 MCG/ACT nasal spray 1 spray  1 spray Each Nare Daily PRN Parks Ranger, DO  hydrOXYzine (ATARAX) tablet 50 mg  50 mg Oral Q6H PRN Clapacs, John T, MD   50 mg at 05/05/21 0904   ibuprofen (ADVIL) tablet 400 mg  400 mg Oral Q6H PRN Parks Ranger, DO       loratadine (CLARITIN) tablet 10 mg  10 mg Oral Daily Parks Ranger, DO   10 mg at 05/05/21 [id number]   magnesium hydroxide (MILK OF MAGNESIA) suspension 30 mL  30 mL Oral Daily PRN Parks Ranger, DO       metoprolol succinate (TOPROL-XL) 24 hr tablet 25 mg  25 mg Oral Daily Parks Ranger, DO   25 mg at 05/05/21 [id number]   nicotine polacrilex (NICORETTE) gum 2 mg  2 mg Oral PRN Clapacs, Madie Reno, MD   2 mg at 05/05/21 0906   risperiDONE (RISPERDAL) tablet 0.5 mg  0.5 mg Oral [id number] Parks Ranger, DO       traZODone (DESYREL) tablet 100 mg  100 mg Oral QHS PRN Parks Ranger, DO   100 mg at 05/04/21 1932    Lab  Results: No results found for this or any previous visit (from the past 35 hour(s)).  Blood Alcohol level:  Lab Results  Component Value Date   ETH <10 05/01/2021   ETH 136 (H) 123XX123    Metabolic Disorder Labs: Lab Results  Component Value Date   HGBA1C 5.7 (H) 04/13/2021   MPG 117 04/13/2021   No results found for: PROLACTIN Lab Results  Component Value Date   CHOL 211 (H) 04/13/2021   TRIG 229 (H) 04/13/2021   HDL 56 04/13/2021   CHOLHDL 3.8 04/13/2021   VLDL 46 (H) 04/13/2021   LDLCALC 109 (H) 04/13/2021    Physical Findings: AIMS:  , ,  ,  ,    CIWA:    COWS:     Musculoskeletal: Strength & Muscle Tone: within normal limits Gait & Station: normal Patient leans: N/A  Psychiatric Specialty Exam:  Presentation  General Appearance: Appropriate for Environment; Casual  Eye Contact:Fair  Speech:Clear and Coherent; Normal Rate; Other (comment) (irritable tone of voice)  Speech Volume:Normal  Handedness:Left   Mood and Affect  Mood:Irritable  Affect:Appropriate; Congruent; Other (comment) (angry)   Thought Process  Thought Processes:Coherent; Goal Directed; Linear  Descriptions of Associations:Intact  Orientation:Full (Time, Place and Person)  Thought Content:WDL; Logical  History of Schizophrenia/Schizoaffective disorder:No  Duration of Psychotic Symptoms:No data recorded Hallucinations:No data recorded Ideas of Reference:None  Suicidal Thoughts:No data recorded Homicidal Thoughts:No data recorded  Sensorium  Memory:Immediate Good; Recent Good; Remote Fair  Judgment:Fair  Insight:Fair   Executive Functions  Concentration:Fair  Attention Span:Fair  [place]  Language:Good   Psychomotor Activity  Psychomotor Activity:No data recorded  Assets  Assets:Communication Skills; Desire for Improvement; Housing; Physical Health; Social Support; Transportation   Sleep  Sleep:No data  recorded   Physical Exam: Physical Exam Vitals and nursing note reviewed.  Constitutional:      Appearance: Normal appearance. She is normal weight.  Neurological:     General: No focal deficit present.     Mental Status: She is alert and oriented to person, place, and time.  Psychiatric:        Attention and Perception: Attention and perception normal.        Mood and Affect: Mood is anxious and depressed. Affect is labile.        Speech: Speech normal.        Behavior: Behavior normal.  Behavior is cooperative.        Thought Content: Thought content includes suicidal ideation.        Cognition and Memory: Cognition and memory normal.        Judgment: Judgment normal.   Review of Systems  Constitutional: Negative.   HENT: Negative.    Eyes: Negative.   Respiratory: Negative.    Cardiovascular: Negative.   Gastrointestinal: Negative.   Genitourinary: Negative.   Musculoskeletal: Negative.   Skin: Negative.   Neurological: Negative.   Endo/Heme/Allergies: Negative.   Psychiatric/Behavioral:  Positive for depression and suicidal ideas. The patient is nervous/anxious.   Blood pressure (!) 128/92, pulse 76, temperature 97.6 F (36.4 C), temperature source Oral, resp. rate 20, height 5\' 3"  (1.6 m), weight 57.6 kg, last menstrual period 01/30/2015, SpO2 98 %. Body mass index is 22.5 kg/m.   Treatment Plan Summary: Daily contact with patient to assess and evaluate symptoms and progress in treatment, Medication management, and Plan start Prozac 20 mg/day and Risperdal 0.5 mg twice a day.  Parks Ranger, DO 05/05/2021, 10:33 AM

## 2021-05-06 DIAGNOSIS — F332 Major depressive disorder, recurrent severe without psychotic features: Secondary | ICD-10-CM | POA: Diagnosis not present

## 2021-05-06 NOTE — Progress Notes (Signed)
Pt standing at nurses' station; calm, cooperative. Pt states "I'm feeling great! I've seen my sweetheart for Valentine's day." Pt c/o pain in both hips, which she rates 8 on 0-10 pain scale and describes as "dull, achy; always there"; pt requested pain medication. She reports that she had problems staying asleep last night. She says "I eat everything on my plate" when asked about her appetite. She denies anxiety and depression at this time. No acute distress noted.

## 2021-05-06 NOTE — Progress Notes (Signed)
[facility] MD Progress Note  05/06/2021 1:07 PM Whitney Hunt  MRN:  [id number] Subjective: Whitney Hunt states that she is feeling much better today.  We started her on Risperdal and Prozac and she has not had any crying episodes.  She tells me that she slept well last night.  She did take trazodone 100 mg.  She is still depressed with a flat affect but she does state that she does feel better.  Principal Problem: MDD (major depressive disorder), recurrent severe, without psychosis ([place]) Diagnosis: Principal Problem:   MDD (major depressive disorder), recurrent severe, without psychosis ([place]) Active Problems:   Cocaine abuse ([place])   Arthritis   Paroxysmal supraventricular tachycardia ([place])  Total Time spent with patient: 15 minutes  Past Psychiatric History: None  Past Medical History: History reviewed. No pertinent past medical history.  Past Surgical History:  Procedure Laterality Date   TUBAL LIGATION  2010   Family History:  Family History  Problem Relation Age of Onset   Cervical cancer Sister 44   Colon cancer Neg Hx    Stomach cancer Neg Hx    Rectal cancer Neg Hx     Social History:  Social History   Substance and Sexual Activity  Alcohol Use No     Social History   Substance and Sexual Activity  Drug Use No    Social History   Socioeconomic History   Marital status: Married    Spouse name: Not on file   Number of children: Not on file   Years of education: Not on file   Highest education level: Not on file  Occupational History   Not on file  Tobacco Use   Smoking status: Every Day    Packs/day: 0.50    Types: Cigarettes   Smokeless tobacco: Never  Vaping Use   Vaping Use: Never used  Substance and Sexual Activity   Alcohol use: No   Drug use: No   Sexual activity: Yes  Other Topics Concern   Not on file  Social History Narrative   Not on file   Social Determinants of Health   Financial Resource Strain: Not on file  Food Insecurity: Not on file   Transportation Needs: Not on file  Physical Activity: Not on file  Stress: Not on file  Social Connections: Not on file   Additional Social History:                         Sleep: Good  Appetite:  Good  Current Medications: Current Facility-Administered Medications  Medication Dose Route Frequency Provider Last Rate Last Admin   acetaminophen (TYLENOL) tablet 650 mg  650 mg Oral Q6H PRN Parks Ranger, DO   650 mg at 05/06/21 0803   alum & mag hydroxide-simeth (MAALOX/MYLANTA) 200-200-20 MG/5ML suspension 30 mL  30 mL Oral Q4H PRN Parks Ranger, DO       cyclobenzaprine (FLEXERIL) tablet 10 mg  10 mg Oral TID PRN Parks Ranger, DO       FLUoxetine (PROZAC) capsule 20 mg  20 mg Oral Daily Parks Ranger, DO   20 mg at 05/06/21 0916   fluticasone (FLONASE) 50 MCG/ACT nasal spray 1 spray  1 spray Each Nare Daily PRN Parks Ranger, DO       hydrOXYzine (ATARAX) tablet 50 mg  50 mg Oral Q6H PRN Clapacs, Madie Reno, MD   50 mg at 05/05/21 2133   ibuprofen (ADVIL) tablet 400 mg  400 mg Oral Q6H PRN Parks Ranger, DO       loratadine (CLARITIN) tablet 10 mg  10 mg Oral Daily Parks Ranger, DO   10 mg at 05/06/21 [id number]   magnesium hydroxide (MILK OF MAGNESIA) suspension 30 mL  30 mL Oral Daily PRN Parks Ranger, DO       metoprolol succinate (TOPROL-XL) 24 hr tablet 25 mg  25 mg Oral Daily Parks Ranger, DO   25 mg at 05/06/21 [id number]   nicotine polacrilex (NICORETTE) gum 2 mg  2 mg Oral PRN Clapacs, Madie Reno, MD   2 mg at 05/06/21 1207   risperiDONE (RISPERDAL) tablet 0.5 mg  0.5 mg Oral [id number] Parks Ranger, DO   0.5 mg at 05/06/21 [id number]   traZODone (DESYREL) tablet 100 mg  100 mg Oral QHS PRN Parks Ranger, DO   100 mg at 05/05/21 2132    Lab Results: No results found for this or any previous visit (from the past 48 hour(s)).  Blood Alcohol level:  Lab Results  Component Value  Date   ETH <10 05/01/2021   ETH 136 (H) 123XX123    Metabolic Disorder Labs: Lab Results  Component Value Date   HGBA1C 5.7 (H) 04/13/2021   MPG 117 04/13/2021   No results found for: PROLACTIN Lab Results  Component Value Date   CHOL 211 (H) 04/13/2021   TRIG 229 (H) 04/13/2021   HDL 56 04/13/2021   CHOLHDL 3.8 04/13/2021   VLDL 46 (H) 04/13/2021   LDLCALC 109 (H) 04/13/2021    Physical Findings: AIMS:  , ,  ,  ,    CIWA:    COWS:     Musculoskeletal: Strength & Muscle Tone: within normal limits Gait & Station: normal Patient leans: N/A  Psychiatric Specialty Exam:  Presentation  General Appearance: Appropriate for Environment; Casual  Eye Contact:Fair  Speech:Clear and Coherent; Normal Rate; Other (comment) (irritable tone of voice)  Speech Volume:Normal  Handedness:Left   Mood and Affect  Mood:Irritable  Affect:Appropriate; Congruent; Other (comment) (angry)   Thought Process  Thought Processes:Coherent; Goal Directed; Linear  Descriptions of Associations:Intact  Orientation:Full (Time, Place and Person)  Thought Content:WDL; Logical  History of Schizophrenia/Schizoaffective disorder:No  Duration of Psychotic Symptoms:No data recorded Hallucinations:No data recorded Ideas of Reference:None  Suicidal Thoughts:No data recorded Homicidal Thoughts:No data recorded  Sensorium  Memory:Immediate Good; Recent Good; Remote Fair  Judgment:Fair  Insight:Fair   Executive Functions  Concentration:Fair  Attention Span:Fair  [place]  Language:Good   Psychomotor Activity  Psychomotor Activity:No data recorded  Assets  Assets:Communication Skills; Desire for Improvement; Housing; Physical Health; Social Support; Transportation   Sleep  Sleep:No data recorded   Physical Exam: Physical Exam Vitals and nursing note reviewed.  Constitutional:      Appearance: Normal appearance. She is normal weight.   Neurological:     General: No focal deficit present.     Mental Status: She is alert and oriented to person, place, and time.  Psychiatric:        Attention and Perception: Attention and perception normal.        Mood and Affect: Mood normal.        Speech: Speech normal.        Behavior: Behavior normal. Behavior is cooperative.        Thought Content: Thought content normal.        Cognition and Memory: Cognition and memory normal.  Judgment: Judgment normal.   Review of Systems  Constitutional: Negative.   HENT: Negative.    Eyes: Negative.   Respiratory: Negative.    Cardiovascular: Negative.   Gastrointestinal: Negative.   Genitourinary: Negative.   Musculoskeletal: Negative.   Skin: Negative.   Neurological: Negative.   Endo/Heme/Allergies: Negative.   Psychiatric/Behavioral:  Positive for depression.   Blood pressure 111/78, pulse 79, temperature 97.9 F (36.6 C), temperature source Oral, resp. rate 18, height 5\' 3"  (1.6 m), weight 57.6 kg, last menstrual period 01/30/2015, SpO2 97 %. Body mass index is 22.5 kg/m.   Treatment Plan Summary: Daily contact with patient to assess and evaluate symptoms and progress in treatment, Medication management, and Plan continue current medications.  Parks Ranger, DO 05/06/2021, 1:07 PM

## 2021-05-06 NOTE — Progress Notes (Signed)
Pt approached nurses' station requesting sleep medication, stating "I don't feel sleepy."

## 2021-05-06 NOTE — Progress Notes (Signed)
Patient denies SI, HI, and AVH. She feels that the Prozac and Risperdal are helping her and she has had fewer crying spells. Patient reports good sleep and appetite. She endorses hip pain at a 7/10, Tylenol PRN was given. Patient is active on the unit and is observed to be interacting appropriately with staff and other patients. Patient is compliant with scheduled medications. She remains safe on the unit at this time.

## 2021-05-06 NOTE — Progress Notes (Signed)
Patient alert and oriented x 4 no distress noted, interacting appropriately with peers and staff, no distress noted. Patient denies SI/HI/AVH. Patient is complaint with medication regimen, 15 minutes safety checks maintained.

## 2021-05-06 NOTE — Progress Notes (Signed)
Recreation Therapy Notes  Date: 05/06/2021  Time: 1:20 pm    Location:  [facility]   Behavioral response: Appropriate  Intervention Topic:  Social skills   Discussion/Intervention:  Group content on today was focused on social skills. The group defined social skills and identified ways they use social skills. Patients expressed what obstacles they face when trying to be social. Participants described the importance of social skills. The group listed ways to improve social skills and reasons to improve social skills. Individuals had an opportunity to learn new and improve social skills as well as identify their weaknesses. Clinical Observations/Feedback: Patient came to group and was focused on what peers and staff had to say about social skills.  Individual participated in the intervention and was social with peers and staff.  Nikitia Asbill LRT/CTRS         Serita Degroote 05/06/2021 2:52 PM

## 2021-05-07 NOTE — Plan of Care (Signed)
Patient is alert, calm and cooperative during assessment. Patient report sleeping till about 4 am, also verbalize receiving PRN sleep aid. Report pain 7/10. Denies anxiety and depression. Denies SI, HI, and AVH. Ate breakfast in the day room among peers and tolerated well. Patient is currently in the day room watching television. Remain safe on the unit with q 15 minute safety checks.  Problem: Education: Goal: Knowledge of [facility] Education information/materials will improve Outcome: Progressing Goal: Emotional status will improve Outcome: Progressing Goal: Mental status will improve Outcome: Progressing Goal: Verbalization of understanding the information provided will improve Outcome: Progressing   Problem: Activity: Goal: Interest or engagement in activities will improve Outcome: Progressing Goal: Sleeping patterns will improve Outcome: Progressing   Problem: Coping: Goal: Ability to verbalize frustrations and anger appropriately will improve Outcome: Progressing Goal: Ability to demonstrate self-control will improve Outcome: Progressing   Problem: Health Behavior/Discharge Planning: Goal: Identification of resources available to assist in meeting health care needs will improve Outcome: Progressing Goal: Compliance with treatment plan for underlying cause of condition will improve Outcome: Progressing   Problem: Safety: Goal: Periods of time without injury will increase Outcome: Progressing

## 2021-05-07 NOTE — Progress Notes (Signed)
[facility] MD Progress Note  05/07/2021 1:24 PM Whitney Hunt  MRN:  [id number] Subjective: Whitney Hunt tells me that she thinks the medications are helping because she has not had any crying episodes.  Her mood is improved.  She is tolerating the medications without any side effects.  We discussed discharge on Friday and she thinks that that will be appropriate.  Because, it gives her the weekend before she goes back to work.  Principal Problem: MDD (major depressive disorder), recurrent severe, without psychosis ([place]) Diagnosis: Principal Problem:   MDD (major depressive disorder), recurrent severe, without psychosis ([place]) Active Problems:   Cocaine abuse ([place])   Arthritis   Paroxysmal supraventricular tachycardia ([place])  Total Time spent with patient: 15 minutes  Past Psychiatric History:  Cocaine Abuse, No Hospitalizations, Treated by OBGYN with Effexor but not compliant  Past Medical History: History reviewed. No pertinent past medical history.  Past Surgical History:  Procedure Laterality Date   TUBAL LIGATION  2010   Family History:  Family History  Problem Relation Age of Onset   Cervical cancer Sister 45   Colon cancer Neg Hx    Stomach cancer Neg Hx    Rectal cancer Neg Hx     Social History:  Social History   Substance and Sexual Activity  Alcohol Use No     Social History   Substance and Sexual Activity  Drug Use No    Social History   Socioeconomic History   Marital status: Married    Spouse name: Not on file   Number of children: Not on file   Years of education: Not on file   Highest education level: Not on file  Occupational History   Not on file  Tobacco Use   Smoking status: Every Day    Packs/day: 0.50    Types: Cigarettes   Smokeless tobacco: Never  Vaping Use   Vaping Use: Never used  Substance and Sexual Activity   Alcohol use: No   Drug use: No   Sexual activity: Yes  Other Topics Concern   Not on file  Social History Narrative   Not on file    Social Determinants of Health   Financial Resource Strain: Not on file  Food Insecurity: Not on file  Transportation Needs: Not on file  Physical Activity: Not on file  Stress: Not on file  Social Connections: Not on file   Additional Social History:                         Sleep: Good  Appetite:  Good  Current Medications: Current Facility-Administered Medications  Medication Dose Route Frequency Provider Last Rate Last Admin   acetaminophen (TYLENOL) tablet 650 mg  650 mg Oral Q6H PRN Parks Ranger, DO   650 mg at 05/07/21 0627   alum & mag hydroxide-simeth (MAALOX/MYLANTA) 200-200-20 MG/5ML suspension 30 mL  30 mL Oral Q4H PRN Parks Ranger, DO       cyclobenzaprine (FLEXERIL) tablet 10 mg  10 mg Oral TID PRN Parks Ranger, DO       FLUoxetine (PROZAC) capsule 20 mg  20 mg Oral Daily Parks Ranger, DO   20 mg at 05/07/21 0952   fluticasone (FLONASE) 50 MCG/ACT nasal spray 1 spray  1 spray Each Nare Daily PRN Parks Ranger, DO       hydrOXYzine (ATARAX) tablet 50 mg  50 mg Oral Q6H PRN Clapacs, Whitney Reno, MD   50  mg at 05/05/21 2133   ibuprofen (ADVIL) tablet 400 mg  400 mg Oral Q6H PRN Parks Ranger, DO       loratadine (CLARITIN) tablet 10 mg  10 mg Oral Daily Parks Ranger, DO   10 mg at 05/07/21 [id number]   magnesium hydroxide (MILK OF MAGNESIA) suspension 30 mL  30 mL Oral Daily PRN Parks Ranger, DO       metoprolol succinate (TOPROL-XL) 24 hr tablet 25 mg  25 mg Oral Daily Parks Ranger, DO   25 mg at 05/07/21 [id number]   nicotine polacrilex (NICORETTE) gum 2 mg  2 mg Oral PRN Clapacs, Whitney Reno, MD   2 mg at 05/07/21 1253   risperiDONE (RISPERDAL) tablet 0.5 mg  0.5 mg Oral [id number] Parks Ranger, DO   0.5 mg at 05/07/21 0815   traZODone (DESYREL) tablet 100 mg  100 mg Oral QHS PRN Parks Ranger, DO   100 mg at 05/06/21 2151    Lab Results: No results found for this  or any previous visit (from the past 48 hour(s)).  Blood Alcohol level:  Lab Results  Component Value Date   ETH <10 05/01/2021   ETH 136 (H) 123XX123    Metabolic Disorder Labs: Lab Results  Component Value Date   HGBA1C 5.7 (H) 04/13/2021   MPG 117 04/13/2021   No results found for: PROLACTIN Lab Results  Component Value Date   CHOL 211 (H) 04/13/2021   TRIG 229 (H) 04/13/2021   HDL 56 04/13/2021   CHOLHDL 3.8 04/13/2021   VLDL 46 (H) 04/13/2021   LDLCALC 109 (H) 04/13/2021    Physical Findings: AIMS:  , ,  ,  ,    CIWA:    COWS:     Musculoskeletal: Strength & Muscle Tone: within normal limits Gait & Station: normal Patient leans: N/A  Psychiatric Specialty Exam:  Presentation  General Appearance: Appropriate for Environment; Casual  Eye Contact:Fair  Speech:Clear and Coherent; Normal Rate; Other (comment) (irritable tone of voice)  Speech Volume:Normal  Handedness:Left   Mood and Affect  Mood:Irritable  Affect:Appropriate; Congruent; Other (comment) (angry)   Thought Process  Thought Processes:Coherent; Goal Directed; Linear  Descriptions of Associations:Intact  Orientation:Full (Time, Place and Person)  Thought Content:WDL; Logical  History of Schizophrenia/Schizoaffective disorder:No  Duration of Psychotic Symptoms:No data recorded Hallucinations:No data recorded Ideas of Reference:None  Suicidal Thoughts:No data recorded Homicidal Thoughts:No data recorded  Sensorium  Memory:Immediate Good; Recent Good; Remote Fair  Judgment:Fair  Insight:Fair   Executive Functions  Concentration:Fair  Attention Span:Fair  [place]  Language:Good   Psychomotor Activity  Psychomotor Activity:No data recorded  Assets  Assets:Communication Skills; Desire for Improvement; Housing; Physical Health; Social Support; Transportation   Sleep  Sleep:No data recorded   Physical Exam: Physical Exam Vitals  and nursing note reviewed.  Constitutional:      Appearance: Normal appearance. She is normal weight.  Neurological:     General: No focal deficit present.     Mental Status: She is alert and oriented to person, place, and time.  Psychiatric:        Attention and Perception: Attention and perception normal.        Mood and Affect: Affect normal. Mood is anxious and depressed.        Speech: Speech normal.        Behavior: Behavior normal. Behavior is cooperative.        Thought Content: Thought content normal.  Cognition and Memory: Cognition and memory normal.        Judgment: Judgment normal.   Review of Systems  Constitutional: Negative.   HENT: Negative.    Eyes: Negative.   Respiratory: Negative.    Cardiovascular: Negative.   Gastrointestinal: Negative.   Genitourinary: Negative.   Musculoskeletal: Negative.   Skin: Negative.   Neurological: Negative.   Endo/Heme/Allergies: Negative.   Psychiatric/Behavioral: Negative.    Blood pressure 131/89, pulse 89, temperature 98.1 F (36.7 C), temperature source Oral, resp. rate 18, height 5\' 3"  (1.6 m), weight 57.6 kg, last menstrual period 01/30/2015, SpO2 96 %. Body mass index is 22.5 kg/m.   Treatment Plan Summary: Daily contact with patient to assess and evaluate symptoms and progress in treatment, Medication management, and Plan continue current medications.  Discharge plans for Friday.  Jarratt, DO 05/07/2021, 1:24 PM

## 2021-05-07 NOTE — Progress Notes (Signed)
Pt sitting in day room; calm, cooperative. Pt states "I'm doing great!", while smiling. She reports that the doctor has already told her that she is going home on Friday. She denies pain and SI/HI/AVH at this time. She reports that she slept "pretty good" last night although she awoke around 4am sweating due to "hot flashes", which she says that she has had "for a while now". Pt states "I eat everything y'all put in front of me" when asked about her appetite. She denies anxiety and depression at this time. No acute distress noted.

## 2021-05-07 NOTE — Progress Notes (Addendum)
Recreation Therapy Notes  Date: 05/07/2021  Time: 1:15pm    Location:  [facility]    Behavioral response: Appropriate  Intervention Topic:  Self-care   Discussion/Intervention:  Group content today was focused on Self-Care. The group defined self-care and some positive ways they care for themselves. Individuals expressed ways and reasons why they neglected any self-care in the past. Patients described ways to improve self-care in the future. The group explained what could happen if they did not do any self-care activities at all. The group participated in the intervention self-care assessment where they had a chance to discover some of their weaknesses and strengths in self- care. Patient came up with a self-care plan to improve themselves in the future.  Clinical Observations/Feedback: Patient came to group and identified self-care as showering and going to church. Participant stated that she can improve her self-care by staying away from triggers.  Individual was social with peers and staff while participating in the intervention.  Jakyiah Briones LRT/CTRS         Marguarite Markov 05/07/2021 2:59 PM

## 2021-05-07 NOTE — B[facility] Counselor (Signed)
CSW spoke with pt's daughter, Radene Ou, [phone], regarding discharge planning. She stated that CSW could contact the [place] office at [phone] to get fax number to fax discharge information to assist in facilitating pt receiving [facility] services.  She also stated that pt has a meeting with VA office in [place] on Friday 05/17/21 to work on [place] benefit services.   CSW will set up pt appointment for outpatient services with community provider until pt's [facility] services have been finalized. Pt's daughter agreed to the plan. CSW will contact if any issues arise.   Kriss Perleberg [place], MSW, LCSW-A 2/15/20233:11 PM

## 2021-05-08 NOTE — Group Note (Signed)
LCSW Group Therapy Note  Group Date: 05/08/2021 Start Time: 1400 End Time: 1430   Type of Therapy and Topic:  Group Therapy - Healthy vs Unhealthy Coping Skills  Participation Level:  Active   Description of Group The focus of this group was to determine what unhealthy coping techniques typically are used by group members and what healthy coping techniques would be helpful in coping with various problems. Patients were guided in becoming aware of the differences between healthy and unhealthy coping techniques. Patients were asked to identify 2-3 healthy coping skills they would like to learn to use more effectively.  Therapeutic Goals Patients learned that coping is what human beings do all day long to deal with various situations in their lives Patients defined and discussed healthy vs unhealthy coping techniques Patients identified their preferred coping techniques and identified whether these were healthy or unhealthy Patients determined 2-3 healthy coping skills they would like to become more familiar with and use more often. Patients provided support and ideas to each other   Summary of Patient Progress:   Patient was present for the entirety of the group session. Patient was an active listener and participated in the topic of discussion, provided helpful advice to others, and added nuance to topic of conversation. CSW spoke with pt individually. CSW accompanied group outside and demonstrated coping skills through outdoor recreation. She stated that she was looking forward to going home and starting therapy. She said that she was feeling better and had a euphoric affect.     Therapeutic Modalities Cognitive Behavioral Therapy Motivational Interviewing  Yoana Staib A [place], LCSWA 05/08/2021  3:47 PM

## 2021-05-08 NOTE — Plan of Care (Signed)
Patient is alert and oriented, calm, compliant and cooperative during assessment. Denies anxiety and depression. Denies SI, HI, AVH. Report sleeping well through the night. Verbalize pain 9/10, medicated PRN Tylenol. Compliant with all due medication. Ate breakfast in the day room among peers with good appetite. Remain safe on the unit with q15 minutes safety checks. Problem: Education: Goal: Knowledge of [facility] Education information/materials will improve Outcome: Progressing Goal: Emotional status will improve Outcome: Progressing Goal: Mental status will improve Outcome: Progressing Goal: Verbalization of understanding the information provided will improve Outcome: Progressing   Problem: Activity: Goal: Interest or engagement in activities will improve Outcome: Progressing Goal: Sleeping patterns will improve Outcome: Progressing   Problem: Coping: Goal: Ability to verbalize frustrations and anger appropriately will improve Outcome: Progressing Goal: Ability to demonstrate self-control will improve Outcome: Progressing   Problem: Health Behavior/Discharge Planning: Goal: Identification of resources available to assist in meeting health care needs will improve Outcome: Progressing Goal: Compliance with treatment plan for underlying cause of condition will improve Outcome: Progressing   Problem: Safety: Goal: Periods of time without injury will increase Outcome: Progressing

## 2021-05-08 NOTE — Progress Notes (Addendum)
Recreation Therapy Notes  Date: 05/08/2021  Time: 1:30 pm   Location:  [facility]    Behavioral response: Appropriate  Intervention Topic:  Communications   Discussion/Intervention:  Group content today was focused on communication. The group defined communication and ways to communicate with others. Individuals stated reason why communication is important and some reasons to communicate with others. Patients expressed if they thought they were good at communicating with others and ways they could improve their communication skills. The group identified important parts of communication and some experiences they have had in the past with communication. The group participated in the intervention where they had a chance to test out their communication skills and identify ways to improve their communication techniques.  Clinical Observations/Feedback: Patient came to group and was able to focus and engage on the topic at hand. Individual was social with peers and staff while participating in the intervention.  Khadir Roam LRT/CTRS         Addilee Neu 05/08/2021 3:09 PM

## 2021-05-08 NOTE — Progress Notes (Signed)
[facility] MD Progress Note  05/08/2021 10:44 AM Whitney Hunt  MRN:  [id number] Subjective: Whitney Hunt continues to do well.  Social work is set up follow-up appointment.  She lives in [place].  She is taking her medications as prescribed and denies any side effects.  All of her lab work came back within normal limits including her hemoglobin A1c, thyroid, lipid panel.  She is no longer crying and denies any suicidal ideation.  She is ready for discharge tomorrow.  Principal Problem: MDD (major depressive disorder), recurrent severe, without psychosis ([place]) Diagnosis: Principal Problem:   MDD (major depressive disorder), recurrent severe, without psychosis ([place]) Active Problems:   Cocaine abuse ([place])   Arthritis   Paroxysmal supraventricular tachycardia ([place])  Total Time spent with patient: 15 minutes  Past Psychiatric History: no  Past Medical History: History reviewed. No pertinent past medical history.  Past Surgical History:  Procedure Laterality Date   TUBAL LIGATION  2010   Family History:  Family History  Problem Relation Age of Onset   Cervical cancer Sister 71   Colon cancer Neg Hx    Stomach cancer Neg Hx    Rectal cancer Neg Hx     Social History:  Social History   Substance and Sexual Activity  Alcohol Use No     Social History   Substance and Sexual Activity  Drug Use No    Social History   Socioeconomic History   Marital status: Married    Spouse name: Not on file   Number of children: Not on file   Years of education: Not on file   Highest education level: Not on file  Occupational History   Not on file  Tobacco Use   Smoking status: Every Day    Packs/day: 0.50    Types: Cigarettes   Smokeless tobacco: Never  Vaping Use   Vaping Use: Never used  Substance and Sexual Activity   Alcohol use: No   Drug use: No   Sexual activity: Yes  Other Topics Concern   Not on file  Social History Narrative   Not on file   Social Determinants of Health    Financial Resource Strain: Not on file  Food Insecurity: Not on file  Transportation Needs: Not on file  Physical Activity: Not on file  Stress: Not on file  Social Connections: Not on file   Additional Social History:                         Sleep: Good  Appetite:  Good  Current Medications: Current Facility-Administered Medications  Medication Dose Route Frequency Provider Last Rate Last Admin   acetaminophen (TYLENOL) tablet 650 mg  650 mg Oral Q6H PRN Parks Ranger, DO   650 mg at 05/08/21 0750   alum & mag hydroxide-simeth (MAALOX/MYLANTA) 200-200-20 MG/5ML suspension 30 mL  30 mL Oral Q4H PRN Parks Ranger, DO       cyclobenzaprine (FLEXERIL) tablet 10 mg  10 mg Oral TID PRN Parks Ranger, DO       FLUoxetine (PROZAC) capsule 20 mg  20 mg Oral Daily Parks Ranger, DO   20 mg at 05/08/21 1014   fluticasone (FLONASE) 50 MCG/ACT nasal spray 1 spray  1 spray Each Nare Daily PRN Parks Ranger, DO       hydrOXYzine (ATARAX) tablet 50 mg  50 mg Oral Q6H PRN Clapacs, Madie Reno, MD   50 mg at 05/05/21 2133  ibuprofen (ADVIL) tablet 400 mg  400 mg Oral Q6H PRN Parks Ranger, DO       loratadine (CLARITIN) tablet 10 mg  10 mg Oral Daily Parks Ranger, DO   10 mg at 05/08/21 1015   magnesium hydroxide (MILK OF MAGNESIA) suspension 30 mL  30 mL Oral Daily PRN Parks Ranger, DO       metoprolol succinate (TOPROL-XL) 24 hr tablet 25 mg  25 mg Oral Daily Parks Ranger, DO   25 mg at 05/08/21 1014   nicotine polacrilex (NICORETTE) gum 2 mg  2 mg Oral PRN Clapacs, Madie Reno, MD   2 mg at 05/08/21 0854   risperiDONE (RISPERDAL) tablet 0.5 mg  0.5 mg Oral BH-[id number] Parks Ranger, DO   0.5 mg at 05/08/21 0749   traZODone (DESYREL) tablet 100 mg  100 mg Oral QHS PRN Parks Ranger, DO   100 mg at 05/07/21 2152    Lab Results: No results found for this or any previous visit (from the  past 48 hour(s)).  Blood Alcohol level:  Lab Results  Component Value Date   ETH <10 05/01/2021   ETH 136 (H) 123XX123    Metabolic Disorder Labs: Lab Results  Component Value Date   HGBA1C 5.7 (H) 04/13/2021   MPG 117 04/13/2021   No results found for: PROLACTIN Lab Results  Component Value Date   CHOL 211 (H) 04/13/2021   TRIG 229 (H) 04/13/2021   HDL 56 04/13/2021   CHOLHDL 3.8 04/13/2021   VLDL 46 (H) 04/13/2021   LDLCALC 109 (H) 04/13/2021    Physical Findings: AIMS:  , ,  ,  ,    CIWA:    COWS:     Musculoskeletal: Strength & Muscle Tone: within normal limits Gait & Station: normal Patient leans: N/A  Psychiatric Specialty Exam:  Presentation  General Appearance: Appropriate for Environment; Casual  Eye Contact:Fair  Speech:Clear and Coherent; Normal Rate; Other (comment) (irritable tone of voice)  Speech Volume:Normal  Handedness:Left   Mood and Affect  Mood:Irritable  Affect:Appropriate; Congruent; Other (comment) (angry)   Thought Process  Thought Processes:Coherent; Goal Directed; Linear  Descriptions of Associations:Intact  Orientation:Full (Time, Place and Person)  Thought Content:WDL; Logical  History of Schizophrenia/Schizoaffective disorder:No  Duration of Psychotic Symptoms:No data recorded Hallucinations:No data recorded Ideas of Reference:None  Suicidal Thoughts:No data recorded Homicidal Thoughts:No data recorded  Sensorium  Memory:Immediate Good; Recent Good; Remote Fair  Judgment:Fair  Insight:Fair   Executive Functions  Concentration:Fair  Attention Span:Fair  [place]  Language:Good   Psychomotor Activity  Psychomotor Activity:No data recorded  Assets  Assets:Communication Skills; Desire for Improvement; Housing; Physical Health; Social Support; Transportation   Sleep  Sleep:No data recorded   Physical Exam: Physical Exam Vitals and nursing note reviewed.   Constitutional:      Appearance: Normal appearance. She is normal weight.  Neurological:     General: No focal deficit present.     Mental Status: She is alert and oriented to person, place, and time.  Psychiatric:        Attention and Perception: Attention and perception normal.        Mood and Affect: Mood and affect normal.        Speech: Speech normal.        Behavior: Behavior normal. Behavior is cooperative.        Thought Content: Thought content normal.        Cognition and Memory:  Cognition and memory normal.        Judgment: Judgment normal.   Review of Systems  Constitutional: Negative.   HENT: Negative.    Eyes: Negative.   Respiratory: Negative.    Cardiovascular: Negative.   Gastrointestinal: Negative.   Genitourinary: Negative.   Musculoskeletal: Negative.   Skin: Negative.   Neurological: Negative.   Endo/Heme/Allergies: Negative.   Psychiatric/Behavioral:  Positive for depression.    Blood pressure (!) 128/95, pulse 82, temperature 97.9 F (36.6 C), temperature source Oral, resp. rate 20, height 5\' 3"  (1.6 m), weight 57.6 kg, last menstrual period 01/30/2015, SpO2 97 %. Body mass index is 22.5 kg/m.   Treatment Plan Summary: Daily contact with patient to assess and evaluate symptoms and progress in treatment, Medication management, and Plan continue current medications.  Discharge tomorrow.  She goes to [facility] in [place], DO 05/08/2021, 10:44 AM

## 2021-05-09 MED ORDER — FLUOXETINE HCL 20 MG PO CAPS: 20.0000 mg | ORAL_CAPSULE | Freq: Every day | ORAL | 3 refills | Status: AC

## 2021-05-09 MED ORDER — RISPERIDONE 0.5 MG PO TABS: 0.5000 mg | ORAL_TABLET | ORAL | 3 refills | Status: AC

## 2021-05-09 MED ORDER — NICOTINE POLACRILEX 2 MG MT GUM: 2.0000 mg | CHEWING_GUM | OROMUCOSAL | 0 refills | Status: AC | PRN

## 2021-05-09 MED ORDER — TRAZODONE HCL 100 MG PO TABS: 100.0000 mg | ORAL_TABLET | Freq: Every evening | ORAL | 3 refills | Status: AC | PRN

## 2021-05-09 NOTE — Progress Notes (Signed)
°  [facility] Adult Case Management Discharge Plan :  Will you be returning to the same living situation after discharge:  Yes,  pt's home At discharge, do you have transportation home?: Yes,  pt's husband is providing transportation Do you have the ability to pay for your medications: Yes,  pt has [employer]  Release of information consent forms completed and in the chart;  Patient's signature needed at discharge.  Patient to Follow up at:  Follow-up [facility]. Follow up on 05/12/2021.   Why: You have an appointment scheduled for hospital follow up on Monday February 20th at [place] have a second appointment for counseling scheduled for February 21st at 10am. Thanks! Contact information: Address: [address] [place], [place] [address] Phone: [phone]                Next level of care provider has access to [place] and Suicide Prevention discussed: Yes,  completed with pt's husband     Has patient been referred to the Quitline?: Patient refused referral  Patient has been referred for addiction treatment: Pt. refused referral  Whitney Hunt, [place] 05/09/2021, 11:37 AM

## 2021-05-09 NOTE — Progress Notes (Signed)
Reviewed discharge instructions with patient including follow up appointment with provider, medication and prescriptions.  Questions answered and understanding verbalized.  Discharge packet given.  All belongings returned to patient after verification completed by staff.   Patient escorted by staff off unit at this time stable without complaint.

## 2021-05-09 NOTE — Progress Notes (Signed)
Recreation Therapy Notes  Date: 05/09/2021  Time: 1:30 pm   Location: Quiet room   Behavioral response: Appropriate  Intervention Topic:  Coping Skills    Discussion/Intervention:  Group content on today was focused on coping skills. The group defined what coping skills are and when they normally use coping skills. Individuals described how they normally cope with thing and the coping skills they normally use. Patients expressed why it is important to cope with things and how not coping with things can affect you. The group participated in the intervention My coping box and made coping boxes while adding coping skills they could use in the future to the box. Clinical Observations/Feedback: Patient came to group late and identified walking, coloring and deep breathing as her coping skills. Individual was social with peers and staff while participating in the intervention.   Cedar Ditullio LRT/CTRS         [facility] 05/09/2021 2:48 PM

## 2021-05-09 NOTE — Progress Notes (Signed)
Pt standing in room; anxious, cooperative. Pt states "I'm not feeling good right now. My husband called me with some news but I don't want to talk about it." She denies pain and SI/HI/AVH at this time. She describes her sleep as "fine" and her appetite as "good". She denies depression; however, she expresses feelings of anxiety, which she rates 7-8/10 due to "upsetting news". Pt requested and was given anxiety medication. No acute distress noted.

## 2021-05-09 NOTE — Progress Notes (Signed)
Patient is alert, calm and cooperative and cheerful this morning. Denies anxiety and depression. Denies SI, HI, and AVH. Denies pain at this time. Patient verbalized looking forward to discharge. Compliant with all due medications. Ate breakfast in the day room among staff and peers with good appetite. Remain safe on the unit with q 15 minute safety check.

## 2021-05-09 NOTE — BHH Suicide Risk Assessment (Signed)
[facility] Discharge Suicide Risk Assessment   Principal Problem: MDD (major depressive disorder), recurrent severe, without psychosis ([place]) Discharge Diagnoses: Principal Problem:   MDD (major depressive disorder), recurrent severe, without psychosis ([place]) Active Problems:   Cocaine abuse ([place])   Arthritis   Paroxysmal supraventricular tachycardia ([place])   Total Time spent with patient: 1 hour  Musculoskeletal: Strength & Muscle Tone: within normal limits Gait & Station: normal Patient leans: N/A  Psychiatric Specialty Exam  Presentation  General Appearance: Appropriate for Environment; Casual  Eye Contact:Fair  Speech:Clear and Coherent; Normal Rate; Other (comment) (irritable tone of voice)  Speech Volume:Normal  Handedness:Left   Mood and Affect  Mood:Irritable  Duration of Depression Symptoms: Greater than two weeks  Affect:Appropriate; Congruent; Other (comment) (angry)   Thought Process  Thought Processes:Coherent; Goal Directed; Linear  Descriptions of Associations:Intact  Orientation:Full (Time, Place and Person)  Thought Content:WDL; Logical  History of Schizophrenia/Schizoaffective disorder:No  Duration of Psychotic Symptoms:No data recorded Hallucinations:No data recorded Ideas of Reference:None  Suicidal Thoughts:No data recorded Homicidal Thoughts:No data recorded  Sensorium  Memory:Immediate Good; Recent Good; Remote Fair  Judgment:Fair  Insight:Fair   Executive Functions  Concentration:Fair  Attention Span:Fair  [place]  Language:Good   Psychomotor Activity  Psychomotor Activity:No data recorded  Assets  Assets:Communication Skills; Desire for Improvement; Housing; Physical Health; Social Support; Transportation   Sleep  Sleep:No data recorded   Blood pressure (!) 134/91, pulse 89, temperature 98 F (36.7 C), temperature source Oral, resp. rate 20, height 5\' 3"  (1.6 m), weight 57.6 kg, last  menstrual period 01/30/2015, SpO2 97 %. Body mass index is 22.5 kg/m.  Mental Status Per Nursing Assessment::   On Admission:  Suicidal ideation indicated by patient  Demographic Factors:  Caucasian  Loss Factors: NA  Historical Factors: NA  Risk Reduction Factors:   Positive social support  Continued Clinical Symptoms:  Depression:   Anhedonia  Cognitive Features That Contribute To Risk:  None    Suicide Risk:  Minimal: No identifiable suicidal ideation.  Patients presenting with no risk factors but with morbid ruminations; may be classified as minimal risk based on the severity of the depressive symptoms   Follow-up [facility]. Follow up on 05/12/2021.   Why: You have an appointment scheduled for hospital follow up on Monday February 20th at [place] have a second appointment for counseling scheduled for February 21st at 10am. Thanks! Contact information: Address: [address] [place], [place] [address] Phone: [phone]                Plan Of Care/Follow-up recommendations: See above   Parks Ranger, DO 05/09/2021, 11:02 AM

## 2021-05-09 NOTE — Discharge Summary (Signed)
Physician Discharge Summary Note  Patient:  Whitney Hunt is an 61 y.o., female MRN:  [id number] DOB:  Aug 13, 1960 Patient phone:  [phone] (home)  Patient address:   [place] [address],  Total Time spent with patient: 1 hour  Date of Admission:  05/02/2021 Date of Discharge: 05/09/2021  Reason for Admission:  Patient seen and chart reviewed.  This is a 61 year old woman who was admitted after presenting to the emergency room with symptoms of depression and suicidal thoughts.  On interview the patient's chief complaint is "I do not want to live anymore".  She states that her mood stays depressed down and negative all the time.  She says this has been going on for 2 years but has been worse for the last couple months.  She reports spells of tearfulness every day.  Feelings of hopelessness.  She denies sleep or appetite problems.  She is very frustrated she says because her fingers with her arthritis have gotten to the point where she has difficulty doing most activities that she used to enjoy.  Patient denies any psychotic symptoms.  Denies homicidal ideation.  Talks quite a bit about how she thinks her husband is a big part of her problem.  She denies that he is in any way abusive to her but says that he is "too passive".  Patient admits to using marijuana daily.  Also admits to alcohol use which she describes to me as being about 2 shots taken 4 times a week.  She admits this has been an increase in the last few months.  She also eventually admits to some cocaine use which she very much minimizes.  I wrote a review of the chart shows however that she was in the emergency room late in January and at that time the drug abuse problem seemed to be much more significant than she is presenting it as being currently.  She does get a prescription for venlafaxine 37.5 mg twice a day from her primary care doctor but has not been taking it.  Principal Problem: MDD (major depressive  disorder), recurrent severe, without psychosis ([place]) Discharge Diagnoses: Principal Problem:   MDD (major depressive disorder), recurrent severe, without psychosis ([place]) Active Problems:   Cocaine abuse ([place])   Arthritis   Paroxysmal supraventricular tachycardia ([place])   Past Psychiatric History:  Patient reports she has had spells of depression previously but no previous hospitalizations.  Denies any suicide attempts.  She says that years ago her OB/GYN prescribed an antidepressant for her which was helpful but she remembers nothing about it.  Recently primary care doctor was prescribing venlafaxine but the patient was noncompliant.  No history of mania.  Review of the chart suggests that substance abuse has been a much more significant problem both currently and in the past with a past history of amphetamine abuse and recently more active cocaine abuse than she is stating at this time.  Past Medical History: History reviewed. No pertinent past medical history.  Past Surgical History:  Procedure Laterality Date   TUBAL LIGATION  2010   Family History:  Family History  Problem Relation Age of Onset   Cervical cancer Sister 41   Colon cancer Neg Hx    Stomach cancer Neg Hx    Rectal cancer Neg Hx    Family Psychiatric  History: Unremarkable Social History:  Social History   Substance and Sexual Activity  Alcohol Use No     Social History   Substance  and Sexual Activity  Drug Use No    Social History   Socioeconomic History   Marital status: Married    Spouse name: Not on file   Number of children: Not on file   Years of education: Not on file   Highest education level: Not on file  Occupational History   Not on file  Tobacco Use   Smoking status: Every Day    Packs/day: 0.50    Types: Cigarettes   Smokeless tobacco: Never  Vaping Use   Vaping Use: Never used  Substance and Sexual Activity   Alcohol use: No   Drug use: No   Sexual activity: Yes  Other Topics  Concern   Not on file  Social History Narrative   Not on file   Social Determinants of Health   Financial Resource Strain: Not on file  Food Insecurity: Not on file  Transportation Needs: Not on file  Physical Activity: Not on file  Stress: Not on file  Social Connections: Not on file    Hospital Course: [place] was admitted on an involuntary basis after presenting with a history of recent use of cocaine and marijuana.  She made statements that she did not want to live anymore but upon admission denied suicidal ideation.  She was very upset that she thought that she had been demoted at work.  She was very emotional and tearful initially.  I spoke to her about changing medications and she agreed.  We started her on Prozac, Risperdal, and trazodone.  After a couple days her mood improved and she stopped crying and stated that she was sleeping better and overall her mood was much better.  Her affect improved.  She did not have any withdrawal symptoms.  It was felt that she maximized hospitalization she was discharged home.  The day of discharge she denied suicidal ideation homicidal ideation auditory or visual hallucinations.  Her judgment insight were good. Her lipid panel showed that her cholesterol was 211 and triglycerides were 229, CBC and CMP were within normal limits, hemoglobin A1c was 5.7  Physical Findings: AIMS:  , ,  ,  ,    CIWA:    COWS:     Musculoskeletal: Strength & Muscle Tone: within normal limits Gait & Station: normal Patient leans: N/A   Psychiatric Specialty Exam:  Presentation  General Appearance: Appropriate for Environment; Casual  Eye Contact:Fair  Speech:Clear and Coherent; Normal Rate; Other (comment) (irritable tone of voice)  Speech Volume:Normal  Handedness:Left   Mood and Affect  Mood:Irritable  Affect:Appropriate; Congruent; Other (comment) (angry)   Thought Process  Thought Processes:Coherent; Goal Directed; Linear  Descriptions of  Associations:Intact  Orientation:Full (Time, Place and Person)  Thought Content:WDL; Logical  History of Schizophrenia/Schizoaffective disorder:No  Duration of Psychotic Symptoms:No data recorded Hallucinations:No data recorded Ideas of Reference:None  Suicidal Thoughts:No data recorded Homicidal Thoughts:No data recorded  Sensorium  Memory:Immediate Good; Recent Good; Remote Fair  Judgment:Fair  Insight:Fair   Executive Functions  Concentration:Fair  Attention Span:Fair  [place]  Language:Good   Psychomotor Activity  Psychomotor Activity:No data recorded  Assets  Assets:Communication Skills; Desire for Improvement; Housing; Physical Health; Social Support; Transportation   Sleep  Sleep:No data recorded   Physical Exam: Physical Exam Vitals and nursing note reviewed.  Constitutional:      Appearance: Normal appearance. She is normal weight.  Neurological:     General: No focal deficit present.     Mental Status: She is alert and oriented  to person, place, and time.  Psychiatric:        Attention and Perception: Attention and perception normal.        Mood and Affect: Mood and affect normal.        Speech: Speech normal.        Behavior: Behavior normal. Behavior is cooperative.        Thought Content: Thought content normal.        Cognition and Memory: Cognition and memory normal.        Judgment: Judgment normal.   Review of Systems  Constitutional: Negative.   HENT: Negative.    Eyes: Negative.   Respiratory: Negative.    Cardiovascular: Negative.   Gastrointestinal: Negative.   Genitourinary: Negative.   Musculoskeletal: Negative.   Skin: Negative.   Neurological: Negative.   Endo/Heme/Allergies: Negative.   Psychiatric/Behavioral: Negative.    Blood pressure (!) 134/91, pulse 89, temperature 98 F (36.7 C), temperature source Oral, resp. rate 20, height 5\' 3"  (1.6 m), weight 57.6 kg, last menstrual period  01/30/2015, SpO2 97 %. Body mass index is 22.5 kg/m.   Social History   Tobacco Use  Smoking Status Every Day   Packs/day: 0.50   Types: Cigarettes  Smokeless Tobacco Never   Tobacco Cessation:  A prescription for an FDA-approved tobacco cessation medication provided at discharge   Blood Alcohol level:  Lab Results  Component Value Date   ETH <10 05/01/2021   ETH 136 (H) 123XX123    Metabolic Disorder Labs:  Lab Results  Component Value Date   HGBA1C 5.7 (H) 04/13/2021   MPG 117 04/13/2021   No results found for: PROLACTIN Lab Results  Component Value Date   CHOL 211 (H) 04/13/2021   TRIG 229 (H) 04/13/2021   HDL 56 04/13/2021   CHOLHDL 3.8 04/13/2021   VLDL 46 (H) 04/13/2021   LDLCALC 109 (H) 04/13/2021    See Psychiatric Specialty Exam and Suicide Risk Assessment completed by Attending Physician prior to discharge.  Discharge destination:  Home  Is patient on multiple antipsychotic therapies at discharge:  No   Has Patient had three or more failed trials of antipsychotic monotherapy by history:  No  Recommended Plan for Multiple Antipsychotic Therapies: NA     Follow-up Information     [facility]. Follow up on 05/12/2021.   Why: You have an appointment scheduled for hospital follow up on Monday February 20th at [facility] have a second appointment for counseling scheduled for February 21st at 10am. Thanks! Contact information: Address: [address] [place], [place] [address] Phone: [phone]                Follow-up recommendations:  See above   Signed: Parks Ranger, DO 05/09/2021, 11:05 AM

## 2021-05-09 NOTE — Progress Notes (Signed)
Recreation Therapy Notes  INPATIENT RECREATION TR PLAN  Patient Details Name: Whitney Hunt MRN: [id number] DOB: [birth year] Today's Date: 05/09/2021  Rec Therapy Plan Is patient appropriate for Therapeutic Recreation?: Yes Treatment times per week: at least 3 Estimated Length of Stay: 5-7 days TR Treatment/Interventions: Group participation (Comment)  Discharge Criteria Pt will be discharged from therapy if:: Discharged Treatment plan/goals/alternatives discussed and agreed upon by:: Patient/family  Discharge Summary Short term goals set: Patient will identify 3 positive coping skills strategies to use post d/c within 5 recreation therapy group sessions Short term goals met: Complete Progress toward goals comments: Groups attended Which groups?: Coping skills, Communication, Social skills, Leisure education, Other (Comment) (self-care) Reason goals not met: N/A Therapeutic equipment acquired: N/A Reason patient discharged from therapy: Discharge from hospital Pt/family agrees with progress & goals achieved: Yes Date patient discharged from therapy: 05/09/21   Lauralee Waters 05/09/2021, 2:52 PM

## 2021-05-09 NOTE — Plan of Care (Signed)
Problem: Coping Skills Goal: STG - Patient will identify 3 positive coping skills strategies to use post d/c within 5 recreation therapy group sessions Description: STG - Patient will identify 3 positive coping skills strategies to use post d/c within 5 recreation therapy group sessions Outcome: Completed/Met

## 2022-01-27 ENCOUNTER — Encounter: Payer: Self-pay | Admitting: Internal Medicine

## 2022-10-06 DIAGNOSIS — Z0000 Encounter for general adult medical examination without abnormal findings: Secondary | ICD-10-CM | POA: Diagnosis not present

## 2022-10-13 DIAGNOSIS — E538 Deficiency of other specified B group vitamins: Secondary | ICD-10-CM | POA: Diagnosis not present

## 2022-10-13 DIAGNOSIS — Z0000 Encounter for general adult medical examination without abnormal findings: Secondary | ICD-10-CM | POA: Diagnosis not present
